# Patient Record
Sex: Female | Born: 1965 | Race: White | Hispanic: No | Marital: Married | Smoking: Never smoker
Health system: Southern US, Community
[De-identification: ages and names within clinical notes are randomized; demographics above are authoritative.]

## PROBLEM LIST (undated history)

## (undated) DIAGNOSIS — F419 Anxiety disorder, unspecified: Secondary | ICD-10-CM

## (undated) DIAGNOSIS — N390 Urinary tract infection, site not specified: Secondary | ICD-10-CM

## (undated) DIAGNOSIS — I10 Essential (primary) hypertension: Secondary | ICD-10-CM

## (undated) DIAGNOSIS — N3010 Interstitial cystitis (chronic) without hematuria: Secondary | ICD-10-CM

## (undated) HISTORY — PX: ABDOMINAL HYSTERECTOMY: SHX81

## (undated) HISTORY — DX: Urinary tract infection, site not specified: N39.0

## (undated) HISTORY — PX: CHOLECYSTECTOMY: SHX55

## (undated) HISTORY — DX: Interstitial cystitis (chronic) without hematuria: N30.10

## (undated) HISTORY — PX: APPENDECTOMY: SHX54

---

## 2008-12-13 ENCOUNTER — Ambulatory Visit: Payer: Self-pay | Admitting: Family Medicine

## 2009-12-19 ENCOUNTER — Ambulatory Visit: Payer: Self-pay | Admitting: Family Medicine

## 2010-03-11 ENCOUNTER — Ambulatory Visit: Payer: Self-pay | Admitting: Obstetrics & Gynecology

## 2010-03-18 ENCOUNTER — Ambulatory Visit: Payer: Self-pay | Admitting: Obstetrics & Gynecology

## 2010-03-19 LAB — PATHOLOGY REPORT

## 2011-10-03 ENCOUNTER — Ambulatory Visit: Payer: Self-pay | Admitting: Family Medicine

## 2013-07-29 ENCOUNTER — Emergency Department: Payer: Self-pay | Admitting: Emergency Medicine

## 2013-07-29 LAB — CBC
HGB: 12 g/dL (ref 12.0–16.0)
MCH: 28.8 pg (ref 26.0–34.0)
MCHC: 32.5 g/dL (ref 32.0–36.0)
Platelet: 289 10*3/uL (ref 150–440)
RBC: 4.17 10*6/uL (ref 3.80–5.20)
RDW: 13.6 % (ref 11.5–14.5)
WBC: 7.4 10*3/uL (ref 3.6–11.0)

## 2013-07-29 LAB — COMPREHENSIVE METABOLIC PANEL
Bilirubin,Total: 0.3 mg/dL (ref 0.2–1.0)
Calcium, Total: 9.1 mg/dL (ref 8.5–10.1)
Chloride: 104 mmol/L (ref 98–107)
Creatinine: 0.65 mg/dL (ref 0.60–1.30)
EGFR (African American): >60
Glucose: 90 mg/dL (ref 65–99)
Osmolality: 273 (ref 275–301)
Potassium: 4.3 mmol/L (ref 3.5–5.1)
SGOT(AST): 24 U/L (ref 15–37)
Sodium: 137 mmol/L (ref 136–145)

## 2013-07-29 LAB — URINALYSIS, COMPLETE
Bilirubin,UR: NEGATIVE
Nitrite: NEGATIVE
RBC,UR: 6 /HPF (ref 0–5)
Specific Gravity: 1.006 (ref 1.003–1.030)
Squamous Epithelial: 8
WBC UR: 16 /HPF (ref 0–5)

## 2013-07-29 LAB — LIPASE, BLOOD: Lipase: 165 U/L (ref 73–393)

## 2013-12-01 ENCOUNTER — Emergency Department: Payer: Self-pay | Admitting: Emergency Medicine

## 2014-06-23 ENCOUNTER — Ambulatory Visit: Payer: Self-pay | Admitting: Physician Assistant

## 2014-06-23 LAB — URINALYSIS, COMPLETE: WBC UR: >30 /HPF (ref 0–5)

## 2014-06-25 LAB — URINE CULTURE

## 2014-07-24 ENCOUNTER — Emergency Department: Payer: Self-pay | Admitting: Emergency Medicine

## 2016-02-21 ENCOUNTER — Ambulatory Visit
Admission: EM | Admit: 2016-02-21 | Discharge: 2016-02-21 | Disposition: A | Discharge status: Home / Self Care | Payer: BLUE CROSS/BLUE SHIELD | Attending: Family Medicine | Admitting: Family Medicine

## 2016-02-21 ENCOUNTER — Encounter: Payer: Self-pay | Admitting: Emergency Medicine

## 2016-02-21 DIAGNOSIS — L299 Pruritus, unspecified: Secondary | ICD-10-CM

## 2016-02-21 DIAGNOSIS — L853 Xerosis cutis: Secondary | ICD-10-CM | POA: Diagnosis not present

## 2016-02-21 DIAGNOSIS — B353 Tinea pedis: Secondary | ICD-10-CM

## 2016-02-21 HISTORY — DX: Anxiety disorder, unspecified: F41.9

## 2016-02-21 HISTORY — DX: Essential (primary) hypertension: I10

## 2016-02-21 MED ORDER — FLUCONAZOLE 150 MG PO TABS: 150.0000 mg | ORAL_TABLET | Freq: Every day | ORAL | Status: DC

## 2016-02-21 NOTE — ED Provider Notes (Signed)
CSN: [id number]     Arrival date & time 02/21/16  1724 History   First MD Initiated Contact with Patient 02/21/16 1758     Chief Complaint  Patient presents with  . Pruritis   (Consider location/radiation/quality/duration/timing/severity/associated sxs/prior Treatment) HPI Comments: 50 yo female with a 3 days h/o severe bilateral feet itching and rash. Denies any fevers, chills, drainage, known contacts with any poison ivy, insect bites.   The history is provided by the patient.    Past Medical History  Diagnosis Date  . Anxiety   . Hypertension    Past Surgical History  Procedure Laterality Date  . Cholecystectomy    . Appendectomy    . Abdominal hysterectomy     History reviewed. No pertinent family history. Social History  Substance Use Topics  . Smoking status: Never Smoker   . Smokeless tobacco: None  . Alcohol Use: No   OB History    No data available     Review of Systems  Allergies  Review of patient's allergies indicates no known allergies.  Home Medications   Prior to Admission medications   Medication Sig Start Date End Date Taking? Authorizing Provider  amitriptyline (ELAVIL) 75 MG tablet Take 75 mg by mouth at bedtime.   Yes Historical Provider, MD  BuPROPion HCl (WELLBUTRIN PO) Take by mouth daily.   Yes Historical Provider, MD  cloNIDine (CATAPRES) 0.1 MG tablet Take 0.1 mg by mouth daily.   Yes Historical Provider, MD  escitalopram (LEXAPRO) 20 MG tablet Take 20 mg by mouth daily.   Yes Historical Provider, MD  metoprolol succinate (TOPROL-XL) 25 MG 24 hr tablet Take 25 mg by mouth daily.   Yes Historical Provider, MD  fluconazole (DIFLUCAN) 150 MG tablet Take 1 tablet (150 mg total) by mouth daily. Repeat in one week 02/21/16   Payton Mccallumrlando Makenah Karas, MD   Meds Ordered and Administered this Visit  Medications - No data to display  BP 151/78 mmHg  Pulse 108  Temp(Src) 98.8 F (37.1 C) (Tympanic)  Resp 16  Ht 5\' 5"  (1.651 m)  Wt 240 lb (108.863 kg)  BMI  39.94 kg/m2  SpO2 97% No data found.   Physical Exam  Skin: Rash noted.  Bilateral feet skin dry skin and interdigital erythema, scaliness  Nursing note and vitals reviewed.   ED Course  Procedures (including critical care time)  Labs Review Labs Reviewed - No data to display  Imaging Review No results found.   Visual Acuity Review  Right Eye Distance:   Left Eye Distance:   Bilateral Distance:    Right Eye Near:   Left Eye Near:    Bilateral Near:         MDM   1. Tinea pedis of both feet   2. Dry skin   3. Pruritus     Discharge Medication List as of 02/21/2016  6:19 PM    START taking these medications   Details  fluconazole (DIFLUCAN) 150 MG tablet Take 1 tablet (150 mg total) by mouth daily. Repeat in one week, Starting 02/21/2016, Until Discontinued, Normal       1.  diagnosis reviewed with patient 2. rx as per orders above; reviewed possible side effects, interactions, risks and benefits  3. Recommend supportive treatment with skin moisturizer cream 4. Follow-up prn if symptoms worsen or don't improve  Payton Mccallumrlando Othello Dickenson, MD 02/21/16 2006

## 2016-02-21 NOTE — ED Notes (Signed)
Patient c/o itching on the bottom and top of her feet that started on Wed.

## 2017-12-02 ENCOUNTER — Other Ambulatory Visit: Payer: Self-pay | Admitting: Family Medicine

## 2017-12-02 DIAGNOSIS — Z1231 Encounter for screening mammogram for malignant neoplasm of breast: Secondary | ICD-10-CM

## 2017-12-08 ENCOUNTER — Ambulatory Visit
Admission: RE | Admit: 2017-12-08 | Discharge: 2017-12-08 | Disposition: A | Discharge status: Home / Self Care | Payer: BLUE CROSS/BLUE SHIELD | Source: Ambulatory Visit | Attending: Family Medicine | Admitting: Family Medicine

## 2017-12-08 ENCOUNTER — Encounter (INDEPENDENT_AMBULATORY_CARE_PROVIDER_SITE_OTHER): Payer: Self-pay

## 2017-12-08 DIAGNOSIS — Z1231 Encounter for screening mammogram for malignant neoplasm of breast: Secondary | ICD-10-CM | POA: Insufficient documentation

## 2017-12-10 ENCOUNTER — Encounter: Payer: Self-pay | Admitting: *Deleted

## 2017-12-10 ENCOUNTER — Ambulatory Visit
Admission: EM | Admit: 2017-12-10 | Discharge: 2017-12-10 | Disposition: A | Discharge status: Home / Self Care | Payer: BLUE CROSS/BLUE SHIELD | Attending: Family Medicine | Admitting: Family Medicine

## 2017-12-10 DIAGNOSIS — M545 Low back pain, unspecified: Secondary | ICD-10-CM

## 2017-12-10 DIAGNOSIS — R1084 Generalized abdominal pain: Secondary | ICD-10-CM | POA: Diagnosis not present

## 2017-12-10 LAB — URINALYSIS, COMPLETE (UACMP) WITH MICROSCOPIC
BACTERIA UA: NONE SEEN
Bilirubin Urine: NEGATIVE
GLUCOSE, UA: NEGATIVE mg/dL
Hgb urine dipstick: NEGATIVE
KETONES UR: NEGATIVE mg/dL
LEUKOCYTES UA: NEGATIVE
Nitrite: NEGATIVE
PROTEIN: NEGATIVE mg/dL
RBC / HPF: NONE SEEN RBC/hpf (ref 0–5)
Specific Gravity, Urine: <1.005 — ABNORMAL LOW (ref 1.005–1.030)
WBC, UA: NONE SEEN WBC/hpf (ref 0–5)
pH: 6 (ref 5.0–8.0)

## 2017-12-10 MED ORDER — CYCLOBENZAPRINE HCL 10 MG PO TABS: 10.0000 mg | ORAL_TABLET | Freq: Three times a day (TID) | ORAL | 0 refills | Status: DC | PRN

## 2017-12-10 MED ORDER — MELOXICAM 15 MG PO TABS: 15.0000 mg | ORAL_TABLET | Freq: Every day | ORAL | 0 refills | Status: DC | PRN

## 2017-12-10 NOTE — Discharge Instructions (Signed)
No evidence of UTI.  Medications as prescribed.  If you worsen, please go to the hospital.  Take care  Dr. Adriana Simasook

## 2017-12-10 NOTE — ED Triage Notes (Signed)
C/o of lower back pain and abd pain. Pt thinks she has kidney infection. Pt has had kidney infection before.

## 2017-12-10 NOTE — ED Provider Notes (Signed)
[facility]  CSN: [id number] Arrival date & time: 12/10/17  1212  History   Chief Complaint Chief Complaint  Patient presents with  . Urinary Tract Infection   HPI  52 year old female presents with low back pain and abdominal pain.  Patient reports recent development of left low back pain and abdominal pain.  She states that her abdomen feels "heavy".  No urinary symptoms.  No nausea vomiting.  No fever or chills.  No constipation or diarrhea.  No flank pain.  No hematuria.  She is status post hysterectomy.  No known exacerbating or relieving factors.  No other complaints at this time.  Past Medical History:  Diagnosis Date  . Anxiety   . Hypertension    Past Surgical History:  Procedure Laterality Date  . ABDOMINAL HYSTERECTOMY    . APPENDECTOMY    . CHOLECYSTECTOMY     OB History   None    Home Medications    Prior to Admission medications   Medication Sig Start Date End Date Taking? Authorizing Provider  amitriptyline (ELAVIL) 75 MG tablet Take 75 mg by mouth at bedtime.   Yes [provider]  BuPROPion HCl (WELLBUTRIN PO) Take by mouth daily.   Yes [provider]  escitalopram (LEXAPRO) 20 MG tablet Take 20 mg by mouth daily.   Yes [provider]  metoprolol succinate (TOPROL-XL) 25 MG 24 hr tablet Take 25 mg by mouth daily.   Yes [provider]  cyclobenzaprine (FLEXERIL) 10 MG tablet Take 1 tablet (10 mg total) by mouth 3 (three) times daily as needed. 12/10/17   Tommie Samsook, Zaakirah Kistner G, DO  meloxicam (MOBIC) 15 MG tablet Take 1 tablet (15 mg total) by mouth daily as needed. 12/10/17   Tommie Samsook, Seanmichael Salmons G, DO   Family History Family History  Problem Relation Age of Onset  . Breast cancer Maternal Aunt        2 mat aunts   Social History Social History   Tobacco Use  . Smoking status: Never Smoker  . Smokeless tobacco: Never Used  Substance Use Topics  . Alcohol use: No  . Drug use: Not on file   Allergies   Patient has  no known allergies.   Review of Systems Review of Systems  Constitutional: Negative for chills and fever.  Gastrointestinal: Positive for abdominal pain. Negative for constipation, diarrhea, nausea and vomiting.  Genitourinary: Negative.    Physical Exam Triage Vital Signs ED Triage Vitals  Enc Vitals Group     BP 12/10/17 1223 114/67     Pulse Rate 12/10/17 1223 79     Resp 12/10/17 1223 16     Temp 12/10/17 1223 98 F (36.7 C)     Temp Source 12/10/17 1223 Oral     SpO2 12/10/17 1223 98 %     Weight 12/10/17 1219 165 lb (74.8 kg)     Height 12/10/17 1219 5\' 4"  (1.626 m)     Head Circumference --      Peak Flow --      Pain Score 12/10/17 1219 8     Pain Loc --      Pain Edu? --      Excl. in GC? --    Updated Vital Signs BP 114/67 (BP Location: Left Arm)   Pulse 79   Temp 98 F (36.7 C) (Oral)   Resp 16   Ht 5\' 4"  (1.626 m)   Wt 165 lb (74.8 kg)   SpO2 98%  BMI 28.32 kg/m   Physical Exam  Constitutional: She is oriented to person, place, and time. She appears well-developed. No distress.  Cardiovascular: Normal rate and regular rhythm.  Pulmonary/Chest: Effort normal and breath sounds normal. She has no wheezes.  Abdominal: Soft.  Tender to palpation diffusely, excluding the right lower quadrant.  No rebound or guarding.  Musculoskeletal:  Left upper lumbar spine with left-sided paraspinal musculature tenderness to palpation.  Neurological: She is alert and oriented to person, place, and time.  Psychiatric: She has a normal mood and affect.  Nursing note and vitals reviewed.  UC Treatments / Results  Labs (all labs ordered are listed, but only abnormal results are displayed) Labs Reviewed  URINALYSIS, COMPLETE (UACMP) WITH MICROSCOPIC - Abnormal; Notable for the following components:      Result Value   Color, Urine STRAW (*)    Specific Gravity, Urine <1.005 (*)    All other components within normal limits    EKG None Radiology No results  found.  Procedures Procedures (including critical care time)  Medications Ordered in UC Medications - No data to display   Initial Impression / Assessment and Plan / UC Course  I have reviewed the triage vital signs and the nursing notes.  Pertinent labs & imaging results that were available during my care of the patient were reviewed by me and considered in my medical decision making (see chart for details).     52 year old female presents with back pain and generalized abdominal pain.  She is well-appearing.  Her exam is nonfocal.  There is no evidence that she has an acute abdomen.  Urinalysis was negative.  She does not have urinary tract infection or pyelonephritis.  I informed her that the etiology of her symptoms is unclear.  Treating with meloxicam and Flexeril as I think this is more muscular in nature.  Final Clinical Impressions(s) / UC Diagnoses   Final diagnoses:  Acute left-sided low back pain without sciatica  Generalized abdominal pain    ED Discharge Orders        Ordered    meloxicam (MOBIC) 15 MG tablet  Daily PRN     12/10/17 1249    cyclobenzaprine (FLEXERIL) 10 MG tablet  3 times daily PRN     12/10/17 1249     Controlled Substance Prescriptions [place] Controlled Substance Registry consulted? Not Applicable   Tommie Sams, DO 12/10/17 1337

## 2017-12-16 ENCOUNTER — Inpatient Hospital Stay
Admission: RE | Admit: 2017-12-16 | Discharge: 2017-12-16 | Disposition: A | Discharge status: Home / Self Care | Payer: Self-pay | Source: Ambulatory Visit | Attending: *Deleted | Admitting: *Deleted

## 2017-12-16 ENCOUNTER — Other Ambulatory Visit: Payer: Self-pay | Admitting: *Deleted

## 2017-12-16 ENCOUNTER — Ambulatory Visit: Payer: Self-pay | Admitting: Obstetrics & Gynecology

## 2017-12-16 DIAGNOSIS — Z9289 Personal history of other medical treatment: Secondary | ICD-10-CM

## 2020-01-05 ENCOUNTER — Other Ambulatory Visit: Payer: Self-pay

## 2020-01-05 ENCOUNTER — Ambulatory Visit
Admission: EM | Admit: 2020-01-05 | Discharge: 2020-01-05 | Disposition: A | Discharge status: Home / Self Care | Payer: BC Managed Care – PPO | Attending: Family Medicine | Admitting: Family Medicine

## 2020-01-05 ENCOUNTER — Encounter: Payer: Self-pay | Admitting: Emergency Medicine

## 2020-01-05 DIAGNOSIS — N390 Urinary tract infection, site not specified: Secondary | ICD-10-CM | POA: Diagnosis present

## 2020-01-05 LAB — URINALYSIS, COMPLETE (UACMP) WITH MICROSCOPIC
Bilirubin Urine: NEGATIVE
Glucose, UA: NEGATIVE mg/dL
Ketones, ur: NEGATIVE mg/dL
Nitrite: NEGATIVE
Protein, ur: NEGATIVE mg/dL
Specific Gravity, Urine: 1.02 (ref 1.005–1.030)
WBC, UA: >50 WBC/hpf (ref 0–5)
pH: 7 (ref 5.0–8.0)

## 2020-01-05 MED ORDER — NITROFURANTOIN MONOHYD MACRO 100 MG PO CAPS: 100.0000 mg | ORAL_CAPSULE | Freq: Two times a day (BID) | ORAL | 0 refills | Status: DC

## 2020-01-05 MED ORDER — PHENAZOPYRIDINE HCL 200 MG PO TABS: 200.0000 mg | ORAL_TABLET | Freq: Three times a day (TID) | ORAL | 0 refills | Status: DC | PRN

## 2020-01-05 NOTE — Discharge Instructions (Addendum)
It was very nice seeing you today in clinic. Thank you for entrusting me with your care.  ° °As discussed, your urine is POSITIVE for infection. Will approach treatment as follows: ° °Prescription has been sent to your pharmacy for antibiotics.  °Please pick up and take as directed. FINISH the entire course of medication even if you are feeling better.  °A culture will be sent on your provided sample. If it comes back resistant to what I have prescribed you, someone will call you and let you know that we will need to change antibiotics. °Increase fluid intake as much as possible to flush your urinary tract.  °Water is always the best.  °Avoid caffeine until your infection clears up, as it can contribute to painful bladder spasms.  °May use Tylenol and/or Ibuprofen as needed for pain/fever. ° °Make arrangements to follow up with your regular doctor in 1 week for re-evaluation. If your symptoms/condition worsens, please seek follow up care either here or in the ER. Please remember, our [facility] providers are "right here with you" when you need us.  ° °Again, it was my pleasure to take care of you today. Thank you for choosing our clinic. I hope that you start to feel better quickly.  ° °Danijah Noh, MSN, APRN, FNP-C, CEN °Advanced Practice Provider °[facility] [place] [facility] ° °

## 2020-01-05 NOTE — ED Triage Notes (Signed)
Patient c/o burning when urinating and urinary frequency and urgency that started yesterday.

## 2020-01-06 NOTE — ED Provider Notes (Signed)
[place] [place]   Name: Vanessa Huffman DOB: 22-Nov-1965 MRN: [id number] CSN: [id number] PCP: [facility]  Arrival date and time:  01/05/20 0946  Chief Complaint:  Dysuria  NOTE: Prior to seeing the patient today, I have reviewed the triage nursing documentation and vital signs. Clinical staff has updated patient's PMH/PSHx, current medication list, and drug allergies/intolerances to ensure comprehensive history available to assist in medical decision making.   History:   HPI: Vanessa Huffman is a 54 y.o. female who presents today with complaints of urinary symptoms that began with acute onset last night. She complains of dysuria, frequency, and urgency. She has not appreciated any gross hematuria, nor has she noticed her urine being malodorous. Patient denies any associated nausea, vomiting, fever, and chills. She has experienced pain in her lower back, however not in her flank area or abdomen. She experienced new onset urinary incontinence last night. Patient advises that she does not have a past medical history that is significant for recurrent urinary tract infections. She denies any vaginal pain, bleeding, or discharge.  Past Medical History:  Diagnosis Date  . Anxiety   . Hypertension     Past Surgical History:  Procedure Laterality Date  . ABDOMINAL HYSTERECTOMY    . APPENDECTOMY    . CHOLECYSTECTOMY      Family History  Problem Relation Age of Onset  . Breast cancer Maternal Aunt        2 mat aunts    Social History   Tobacco Use  . Smoking status: Never Smoker  . Smokeless tobacco: Never Used  Substance Use Topics  . Alcohol use: No  . Drug use: Not on file    There are no problems to display for this patient.   Home Medications:    Current Meds  Medication Sig  . amitriptyline (ELAVIL) 75 MG tablet Take 75 mg by mouth at bedtime.  . BuPROPion HCl (WELLBUTRIN PO) Take by mouth daily.  Marland Kitchen escitalopram (LEXAPRO) 20 MG tablet Take 20  mg by mouth daily.  . [DISCONTINUED] metoprolol succinate (TOPROL-XL) 25 MG 24 hr tablet Take 25 mg by mouth daily.    Allergies:   Patient has no known allergies.  Review of Systems (ROS): Review of Systems  Constitutional: Negative for chills and fever.  Respiratory: Negative for cough and shortness of breath.   Cardiovascular: Negative for chest pain and palpitations.  Gastrointestinal: Negative for abdominal pain, nausea and vomiting.  Genitourinary: Positive for dysuria, frequency and urgency. Negative for decreased urine volume, hematuria, pelvic pain, vaginal bleeding, vaginal discharge and vaginal pain.  Musculoskeletal: Positive for back pain.  Skin: Negative for color change, pallor and rash.  Neurological: Negative for dizziness, syncope, weakness and headaches.  All other systems reviewed and are negative.    Vital Signs: Today's Vitals   01/05/20 0959 01/05/20 1002 01/05/20 1033  BP:  134/87   Pulse:  86   Resp:  14   Temp:  98 F (36.7 C)   TempSrc:  Oral   SpO2:  98%   Weight: 210 lb (95.3 kg)    Height: 5\' 5"  (1.651 m)    PainSc: 3   3     Physical Exam: Physical Exam  Constitutional: She is oriented to person, place, and time and well-developed, well-nourished, and in no distress.  HENT:  Head: Normocephalic and atraumatic.  Eyes: Pupils are equal, round, and reactive to light.  Cardiovascular: Normal rate, regular rhythm, normal heart sounds and intact  distal pulses.  Pulmonary/Chest: Effort normal and breath sounds normal.  Abdominal: Soft. Normal appearance. She exhibits no distension. There is abdominal tenderness in the suprapubic area. There is no CVA tenderness.  Neurological: She is alert and oriented to person, place, and time. Gait normal.  Skin: Skin is warm and dry. No rash noted. She is not diaphoretic.  Psychiatric: Memory, affect and judgment normal. Her mood appears anxious.  Nursing note and vitals reviewed.   Urgent Care Treatments  / Results:   LABS: PLEASE NOTE: all labs that were ordered this encounter are listed, however only abnormal results are displayed. Labs Reviewed  URINALYSIS, COMPLETE (UACMP) WITH MICROSCOPIC - Abnormal; Notable for the following components:      Result Value   Color, Urine STRAW (*)    APPearance HAZY (*)    Hgb urine dipstick MODERATE (*)    Leukocytes,Ua LARGE (*)    Bacteria, UA FEW (*)    All other components within normal limits  URINE CULTURE    EKG: -None  RADIOLOGY: No results found.  PROCEDURES: Procedures  MEDICATIONS RECEIVED THIS VISIT: Medications - No data to display  PERTINENT CLINICAL COURSE NOTES/UPDATES:    Initial Impression / Assessment and Plan / Urgent Care Course:  Pertinent labs & imaging results that were available during my care of the patient were personally reviewed by me and considered in my medical decision making (see lab/imaging section of note for values and interpretations).  Vanessa Huffman is a 54 y.o. female who presents to [facility] today with complaints of Dysuria   Patient is well appearing overall in clinic today. She does not appear to be in any acute distress. Presenting symptoms (see HPI) and exam as documented above. Work up in clinic and subsequent plans for treatment as follows:  Marland Kitchen UA was (+) for infection; reflex culture sent.  o Will treat with a 5 day course of nitrofurantoin. Patient encouraged to complete the entire course of antibiotics even if she begins to feel better. She was advised that if culture demonstrates resistance to the prescribed antibiotic, she will be contacted and advised of the need to change the antibiotic being used to treat her infection.   o Patient encouraged to increase her fluid intake as much as possible. Discussed that water is always best to flush the urinary tract.   o She was advised to avoid caffeine containing fluids until her infections clears, as caffeine can cause her to  experience painful bladder spasms.   o May use Tylenol and/or Ibuprofen as needed for pain/fever. Will also send in Rx for phenazopyridine to help relieve her current urinary pain.   Discussed follow up with primary care physician in 1 week for re-evaluation. I have reviewed the follow up and strict return precautions for any new or worsening symptoms. Patient is aware of symptoms that would be deemed urgent/emergent, and would thus require further evaluation either here or in the emergency department. At the time of discharge, she verbalized understanding and consent with the discharge plan as it was reviewed with her. All questions were fielded by provider and/or clinic staff prior to patient discharge.    Final Clinical Impressions / Urgent Care Diagnoses:   Final diagnoses:  Urinary tract infection without hematuria, site unspecified    New Prescriptions:  [place] Controlled Substance Registry consulted? Not Applicable  Meds ordered this encounter  Medications  . nitrofurantoin, macrocrystal-monohydrate, (MACROBID) 100 MG capsule    Sig: Take 1 capsule (100 mg  total) by mouth 2 (two) times daily.    Dispense:  10 capsule    Refill:  0  . phenazopyridine (PYRIDIUM) 200 MG tablet    Sig: Take 1 tablet (200 mg total) by mouth 3 (three) times daily as needed for pain.    Dispense:  9 tablet    Refill:  0    Recommended Follow up Care:  Patient encouraged to follow up with the following provider within the specified time frame, or sooner as dictated by the severity of her symptoms. As always, she was instructed that for any urgent/emergent care needs, she should seek care either here or in the emergency department for more immediate evaluation.  Follow-up Information    [place] In 1 week.   Why: General reassessment of symptoms if not improving Contact information: [address] [place] [address] [phone]         NOTE: This note was prepared using Dragon  dictation software along with smaller phrase technology. Despite my best ability to proofread, there is the potential that transcriptional errors may still occur from this process, and are completely unintentional.    Verlee Monte, NP 01/06/20 1729

## 2020-01-08 LAB — URINE CULTURE: Culture: 60000 — AB

## 2020-02-23 ENCOUNTER — Ambulatory Visit
Admission: EM | Admit: 2020-02-23 | Discharge: 2020-02-23 | Disposition: A | Discharge status: Home / Self Care | Payer: BC Managed Care – PPO | Attending: Family Medicine | Admitting: Family Medicine

## 2020-02-23 ENCOUNTER — Other Ambulatory Visit: Payer: Self-pay

## 2020-02-23 ENCOUNTER — Encounter: Payer: Self-pay | Admitting: Emergency Medicine

## 2020-02-23 DIAGNOSIS — R3915 Urgency of urination: Secondary | ICD-10-CM | POA: Insufficient documentation

## 2020-02-23 LAB — URINALYSIS, COMPLETE (UACMP) WITH MICROSCOPIC: WBC, UA: >50 WBC/hpf (ref 0–5)

## 2020-02-23 MED ORDER — CEPHALEXIN 500 MG PO CAPS: 500.0000 mg | ORAL_CAPSULE | Freq: Two times a day (BID) | ORAL | 0 refills | Status: DC

## 2020-02-23 NOTE — Discharge Instructions (Signed)
Increase water intake

## 2020-02-23 NOTE — ED Triage Notes (Signed)
Patient c/o urinary urgency and frequency and bladder spasms that started last night.

## 2020-02-23 NOTE — ED Provider Notes (Signed)
[facility]    CSN: [id number] Arrival date & time: 02/23/20  1332      History   Chief Complaint Chief Complaint  Patient presents with  . Urinary Urgency    HPI Vanessa Huffman is a 54 y.o. female.   54 yo female with a c/o urinary frequency and urgency since yesterday and associated with nausea. Denies any vomiting, fevers, chills, hematuria, abdominal or back pain.  States symptoms are similar to prior UTIs she's had. Started taking AZO today.      Past Medical History:  Diagnosis Date  . Anxiety   . Hypertension     There are no problems to display for this patient.   Past Surgical History:  Procedure Laterality Date  . ABDOMINAL HYSTERECTOMY    . APPENDECTOMY    . CHOLECYSTECTOMY      OB History   No obstetric history on file.      Home Medications    Prior to Admission medications   Medication Sig Start Date End Date Taking? Authorizing Provider  amitriptyline (ELAVIL) 75 MG tablet Take 75 mg by mouth at bedtime.   Yes [provider]  BuPROPion HCl (WELLBUTRIN PO) Take by mouth daily.   Yes [provider]  escitalopram (LEXAPRO) 20 MG tablet Take 20 mg by mouth daily.   Yes [provider]  cephALEXin (KEFLEX) 500 MG capsule Take 1 capsule (500 mg total) by mouth 2 (two) times daily. 02/23/20   Payton Mccallum, MD  nitrofurantoin, macrocrystal-monohydrate, (MACROBID) 100 MG capsule Take 1 capsule (100 mg total) by mouth 2 (two) times daily. 01/05/20   Verlee Monte, NP  phenazopyridine (PYRIDIUM) 200 MG tablet Take 1 tablet (200 mg total) by mouth 3 (three) times daily as needed for pain. 01/05/20   Verlee Monte, NP  metoprolol succinate (TOPROL-XL) 25 MG 24 hr tablet Take 25 mg by mouth daily.  01/05/20  [provider]    Family History Family History  Problem Relation Age of Onset  . Breast cancer Maternal Aunt        2 mat aunts    Social History Social History   Tobacco Use  . Smoking  status: Never Smoker  . Smokeless tobacco: Never Used  Vaping Use  . Vaping Use: Never used  Substance Use Topics  . Alcohol use: No  . Drug use: Not on file     Allergies   Acetaminophen-codeine and Codeine   Review of Systems Review of Systems   Physical Exam Triage Vital Signs ED Triage Vitals  Enc Vitals Group     BP 02/23/20 1404 (!) 117/58     Pulse Rate 02/23/20 1404 (!) 103     Resp 02/23/20 1404 14     Temp 02/23/20 1404 98.6 F (37 C)     Temp Source 02/23/20 1404 Oral     SpO2 02/23/20 1404 96 %     Weight 02/23/20 1401 215 lb (97.5 kg)     Height 02/23/20 1401 5\' 5"  (1.651 m)     Head Circumference --      Peak Flow --      Pain Score 02/23/20 1401 8     Pain Loc --      Pain Edu? --      Excl. in GC? --    No data found.  Updated Vital Signs BP (!) 117/58 (BP Location: Left Arm)   Pulse (!) 103   Temp 98.6 F (37 C) (  Oral)   Resp 14   Ht 5\' 5"  (1.651 m)   Wt 97.5 kg   SpO2 96%   BMI 35.78 kg/m   Visual Acuity Right Eye Distance:   Left Eye Distance:   Bilateral Distance:    Right Eye Near:   Left Eye Near:    Bilateral Near:     Physical Exam Vitals and nursing note reviewed.  Constitutional:      General: She is not in acute distress.    Appearance: Normal appearance. She is not ill-appearing, toxic-appearing or diaphoretic.  Abdominal:     General: There is no distension.     Palpations: Abdomen is soft.  Neurological:     Mental Status: She is alert.      UC Treatments / Results  Labs (all labs ordered are listed, but only abnormal results are displayed) Labs Reviewed  URINALYSIS, COMPLETE (UACMP) WITH MICROSCOPIC - Abnormal; Notable for the following components:      Result Value   Color, Urine ORANGE (*)    APPearance CLOUDY (*)    Glucose, UA   (*)    Value: TEST NOT REPORTED DUE TO COLOR INTERFERENCE OF URINE PIGMENT   Hgb urine dipstick   (*)    Value: TEST NOT REPORTED DUE TO COLOR INTERFERENCE OF URINE  PIGMENT   Bilirubin Urine   (*)    Value: TEST NOT REPORTED DUE TO COLOR INTERFERENCE OF URINE PIGMENT   Ketones, ur   (*)    Value: TEST NOT REPORTED DUE TO COLOR INTERFERENCE OF URINE PIGMENT   Protein, ur   (*)    Value: TEST NOT REPORTED DUE TO COLOR INTERFERENCE OF URINE PIGMENT   Nitrite   (*)    Value: TEST NOT REPORTED DUE TO COLOR INTERFERENCE OF URINE PIGMENT   Leukocytes,Ua   (*)    Value: TEST NOT REPORTED DUE TO COLOR INTERFERENCE OF URINE PIGMENT   Bacteria, UA FEW (*)    All other components within normal limits  URINE CULTURE    EKG   Radiology No results found.  Procedures Procedures (including critical care time)  Medications Ordered in UC Medications - No data to display  Initial Impression / Assessment and Plan / UC Course  I have reviewed the triage vital signs and the nursing notes.  Pertinent labs & imaging results that were available during my care of the patient were reviewed by me and considered in my medical decision making (see chart for details).      Final Clinical Impressions(s) / UC Diagnoses   Final diagnoses:  Urinary urgency     Discharge Instructions     Increase water intake    ED Prescriptions    Medication Sig Dispense Auth. Provider   cephALEXin (KEFLEX) 500 MG capsule Take 1 capsule (500 mg total) by mouth 2 (two) times daily. 14 capsule , MD      1. Lab results and diagnosis reviewed with patient 2. rx as per orders above; reviewed possible side effects, interactions, risks and benefits  3. Recommend supportive treatment as above 4. Follow-up prn if symptoms worsen or don't improve   PDMP not reviewed this encounter.   Payton Mccallum, MD 02/23/20 1447

## 2020-02-26 LAB — URINE CULTURE: Special Requests: NORMAL

## 2020-03-05 ENCOUNTER — Other Ambulatory Visit: Payer: Self-pay | Admitting: Physician Assistant

## 2020-03-05 DIAGNOSIS — Z1231 Encounter for screening mammogram for malignant neoplasm of breast: Secondary | ICD-10-CM

## 2020-03-12 NOTE — Progress Notes (Signed)
03/13/2020 4:37 PM   Renee Rival Litke [birth year] [id number]  Referring provider: Leim Fabry, MD [address] [place],  [place] [address] Chief Complaint  Patient presents with   Hematuria    HPI: Anastyn Ayars is a 54 y.o. female who presents today for further evaluation and management for UTIs and IC.    She was a previous patient of Dr. Lonna Cobb for microscopic hematuria and pelvic pain last seen in 2015.    CT hematuria on 09/05/2013 at [facility] showed small left parapelvic cyst, otherwise unremarkable.   She was presented to Urgent Care on 01/05/2020. She had some dysuria, frequency and urgency. UA showed moderate blood, large leukocytes and few bacteria. Urin culture indicative for E. Coli. Patient was treated with Macrobid 100 mg BID.   She was seen in the [facility] on 02/23/2020 for urinary urgency. She had associated nausea. Stated symptoms were similar to prior UTIs she's had. Started taking AZO today. UA showed WBC >50, RBC 21-50 and few bacteria. Urine culture showed multiple species present (suggest recollection). She was treated with Keflex 500 mg BID.   She had a follow up with her PCP on 02/29/2020. She had some reoccurring UTI symptoms but her urine culture did not show >100,000. She recently had completed antibiotics for a mild UTI infection and noted some vaginal itching. She reported having a hematuria work up 5 year ago.   Wet prep was negative for clue and yeast. UA showed 1+ blood and 2+ leukocytes. Urine culture showed mixed flora, probable contamination.   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    No personal history of cancer.   She has been taking tylenol and elavil. Urinary symptoms started in 12/2019. She notes a "funny feeling during" urination and at times she stops urinating. She is currently not on AZO.   Denies sexual activity.   She is on an exclusion diet for IC  symptoms. She is interested in starting a new diet to lose weight. Reports eating yogurt and staying away from spicy foods and tomatoes.   Patient reports that she is postmenopausal.    PMH: Past Medical History:  Diagnosis Date   Anxiety    Hypertension     Surgical History: Past Surgical History:  Procedure Laterality Date   ABDOMINAL HYSTERECTOMY     APPENDECTOMY     CHOLECYSTECTOMY      Home Medications:  Allergies as of 03/13/2020      Reactions   Acetaminophen-codeine Other (See Comments)   Lethargy   Codeine Other (See Comments)      Medication List       Accurate as of March 13, 2020  4:37 PM. If you have any questions, ask your nurse or doctor.        STOP taking these medications   cephALEXin 500 MG capsule Commonly known as: KEFLEX Stopped by: Vanna Scotland, MD   nitrofurantoin (macrocrystal-monohydrate) 100 MG capsule Commonly known as: MACROBID Stopped by: Vanna Scotland, MD   phenazopyridine 200 MG tablet Commonly known as: PYRIDIUM Stopped by: Vanna Scotland, MD     TAKE these medications   amitriptyline 75 MG tablet Commonly known as: ELAVIL Take 75 mg by mouth at bedtime.   buPROPion 150 MG 24 hr tablet Commonly known as: WELLBUTRIN XL Take 150 mg by mouth every morning. What changed: Another medication with the same name was removed. Continue taking this medication,  and follow the directions you see here. Changed by: Vanna Scotland, MD   escitalopram 20 MG tablet Commonly known as: LEXAPRO Take 20 mg by mouth daily.       Allergies:  Allergies  Allergen Reactions   Acetaminophen-Codeine Other (See Comments)    Lethargy   Codeine Other (See Comments)    Family History: Family History  Problem Relation Age of Onset   Breast cancer Maternal Aunt        2 mat aunts    Social History:  reports that she has never smoked. She has never used smokeless tobacco. She reports that she does not drink alcohol. No history on  file for drug use.   Physical Exam: BP (!) 148/78    Pulse (!) 106    Ht 5\' 6"  (1.676 m)    Wt (!) 215 lb (97.5 kg)    BMI 34.70 kg/m   Constitutional:  Alert and oriented, No acute distress. HEENT: [place] AT, moist mucus membranes.  Trachea midline, no masses. Cardiovascular: No clubbing, cyanosis, or edema. Respiratory: Normal respiratory effort, no increased work of breathing. Skin: No rashes, bruises or suspicious lesions. Neurologic: Grossly intact, no focal deficits, moving all 4 extremities. Psychiatric: Normal mood and affect.  Urinalysis Cloudy in appearance, nitrite positive, WBC >30, RBC >30, many bacteria.   Assessment & Plan:    1. Acute cystitis  Patient feels as though she has had a couple of infections these past few months. Patient is postmenopausal.  Urine is consistent with infection as seen below. UA shows cloudy appearance, nitrite positive, WBC >30, RBC >30, many bacteria. -Urine culture sent. Advised to use topical estrogen cream (pea size amount) 3 times/week and adding probiotics to her diet. Avoid cranberry tablets secondary to IC.  2. IC Difficult to differentiate at this time due to current symptoms, will reassess once infection clears. Continue exclusion diet. Continue on elavil.  3. Pelvic floor dysfunction Patient will benefit from PT referral but kindly declined at this time due to family obligations.    [facility] [address], [address] [place], [place] [phone]  I, [phone], am acting as a scribe for Dr. Theador Hawthorne.  I have reviewed the above documentation for accuracy and completeness, and I agree with the above.   Vanna Scotland, MD  I spent 45 total minutes on the day of the encounter including pre-visit review of the medical record, face-to-face time with the patient, and post visit ordering of labs/imaging/tests.

## 2020-03-13 ENCOUNTER — Other Ambulatory Visit: Payer: Self-pay

## 2020-03-13 ENCOUNTER — Ambulatory Visit (INDEPENDENT_AMBULATORY_CARE_PROVIDER_SITE_OTHER): Payer: BC Managed Care – PPO | Admitting: Urology

## 2020-03-13 VITALS — BP 148/78 | HR 106 | Ht 66.0 in | Wt 215.0 lb

## 2020-03-13 DIAGNOSIS — R319 Hematuria, unspecified: Secondary | ICD-10-CM

## 2020-03-13 DIAGNOSIS — M6289 Other specified disorders of muscle: Secondary | ICD-10-CM

## 2020-03-13 DIAGNOSIS — N3010 Interstitial cystitis (chronic) without hematuria: Secondary | ICD-10-CM

## 2020-03-13 DIAGNOSIS — N3000 Acute cystitis without hematuria: Secondary | ICD-10-CM

## 2020-03-13 MED ORDER — ESTRADIOL 0.1 MG/GM VA CREA: TOPICAL_CREAM | VAGINAL | 12 refills | Status: DC

## 2020-03-13 MED ORDER — SULFAMETHOXAZOLE-TRIMETHOPRIM 800-160 MG PO TABS: 1.0000 | ORAL_TABLET | Freq: Two times a day (BID) | ORAL | 0 refills | Status: DC

## 2020-03-14 LAB — URINALYSIS, COMPLETE
Bilirubin, UA: NEGATIVE
Glucose, UA: NEGATIVE
Nitrite, UA: POSITIVE — AB
Specific Gravity, UA: >1.03 — ABNORMAL HIGH (ref 1.005–1.030)
Urobilinogen, Ur: 0.2 mg/dL (ref 0.2–1.0)
pH, UA: 5 (ref 5.0–7.5)

## 2020-03-14 LAB — MICROSCOPIC EXAMINATION: WBC, UA: >30 /hpf — AB (ref 0–5)

## 2020-03-16 LAB — CULTURE, URINE COMPREHENSIVE

## 2020-03-18 ENCOUNTER — Telehealth: Payer: Self-pay | Admitting: *Deleted

## 2020-03-18 ENCOUNTER — Encounter: Payer: Self-pay | Admitting: Urology

## 2020-03-18 NOTE — Telephone Encounter (Addendum)
Patient informed, answered all questions regarding negative results. Informed if symptoms persists to contact our office. States her symptoms have improved since taking Bactrim.   ----- Message from Vanna Scotland, MD sent at 03/18/2020  8:35 AM EDT ----- UCx was negative, grew mixed flora.  Vanna Scotland, MD

## 2020-03-22 ENCOUNTER — Other Ambulatory Visit: Payer: Self-pay | Admitting: Family Medicine

## 2020-03-22 DIAGNOSIS — R3129 Other microscopic hematuria: Secondary | ICD-10-CM

## 2020-03-29 ENCOUNTER — Ambulatory Visit
Admission: RE | Admit: 2020-03-29 | Discharge: 2020-03-29 | Disposition: A | Discharge status: Home / Self Care | Payer: BC Managed Care – PPO | Source: Ambulatory Visit | Attending: Family Medicine | Admitting: Family Medicine

## 2020-03-29 ENCOUNTER — Other Ambulatory Visit: Payer: Self-pay

## 2020-03-29 DIAGNOSIS — R3129 Other microscopic hematuria: Secondary | ICD-10-CM | POA: Diagnosis present

## 2020-03-29 MED ORDER — IOHEXOL 300 MG/ML  SOLN
125.0000 mL | Freq: Once | INTRAMUSCULAR | Status: AC | PRN
  Administered 2020-03-29: 125 mL via INTRAVENOUS

## 2020-04-09 ENCOUNTER — Encounter: Payer: Self-pay | Admitting: Urology

## 2020-04-09 NOTE — Telephone Encounter (Signed)
I am not sure you mentioned this to me before.  Is this a new problem?  Let us move your appointment up or have you come in earlier to see a PA in our office to make sure everything is okay, there is no infection, and to make sure you are emptying your bladder.  Vanna Scotland, MD

## 2020-04-10 ENCOUNTER — Other Ambulatory Visit: Payer: Self-pay

## 2020-04-10 ENCOUNTER — Ambulatory Visit: Payer: BC Managed Care – PPO | Admitting: Physician Assistant

## 2020-04-10 VITALS — BP 123/83 | HR 53

## 2020-04-10 DIAGNOSIS — N281 Cyst of kidney, acquired: Secondary | ICD-10-CM

## 2020-04-10 DIAGNOSIS — N2889 Other specified disorders of kidney and ureter: Secondary | ICD-10-CM

## 2020-04-10 DIAGNOSIS — R32 Unspecified urinary incontinence: Secondary | ICD-10-CM

## 2020-04-10 DIAGNOSIS — N3944 Nocturnal enuresis: Secondary | ICD-10-CM

## 2020-04-10 LAB — BLADDER SCAN AMB NON-IMAGING: Scan Result: 31

## 2020-04-10 MED ORDER — MIRABEGRON ER 25 MG PO TB24: 25.0000 mg | ORAL_TABLET | Freq: Every day | ORAL | 0 refills | Status: DC

## 2020-04-10 NOTE — Progress Notes (Signed)
04/10/2020 4:42 PM   Vanessa Huffman December 10, 1965 [id number]  CC: Chief Complaint  Patient presents with  . Urinary Incontinence    HPI: Vanessa Huffman is a 54 y.o. female with PMH occasional versus recurrent UTI, IC on Elavil, and pelvic floor dysfunction who presents today for evaluation of nighttime urinary incontinence.  She was seen most recently by Dr. Apolinar Junes on 03/13/2020 for evaluation of possible recurrent UTI and IC.  She was started on topical vaginal estrogen cream and daily probiotics at that time with plans for symptom recheck in 1 month.  Today she reports an additional symptom of occasional nocturnal enuresis.  She typically experiences nocturia x1, however approximately every 6 weeks, she has an episode of urge incontinence or insensate leaking overnight.  This is sometimes associated with UTI symptoms.  She also reports a "years" long history of daytime urgency and urge incontinence, which began after menopause.  She reports leaking daily and denies stress incontinence.  Overall, she is more bothered by her nighttime symptoms than her daytime symptoms.  She does snore, however she has never completed a sleep study.  She denies lower extremity edema. She drinks 2 cups of coffee each morning followed by water throughout the day, usually stopping at dinnertime around 6 PM.  She does have sips with bedtime meds as well.  She also reports a history of chronic constipation.  Additionally, patient has questions about findings from her recent CT urogram.  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  In-office UA today positive for trace-intact blood and trace leukocyte esterase; urine microscopy with >10 epithelial cells. PVR 48mL.  PMH: Past Medical History:  Diagnosis Date  . Anxiety   . Hypertension     Surgical History: Past Surgical History:  Procedure Laterality Date  . ABDOMINAL HYSTERECTOMY     . APPENDECTOMY    . CHOLECYSTECTOMY      Home Medications:  Allergies as of 04/10/2020      Reactions   Acetaminophen-codeine Other (See Comments)   Lethargy   Codeine Other (See Comments)      Medication List       Accurate as of April 10, 2020 11:59 PM. If you have any questions, ask your nurse or doctor.        amitriptyline 75 MG tablet Commonly known as: ELAVIL Take 75 mg by mouth at bedtime.   amitriptyline 100 MG tablet Commonly known as: ELAVIL Take 100 mg by mouth at bedtime.   atorvastatin 40 MG tablet Commonly known as: LIPITOR Take 40 mg by mouth at bedtime.   buPROPion 150 MG 24 hr tablet Commonly known as: WELLBUTRIN XL Take 150 mg by mouth every morning.   escitalopram 20 MG tablet Commonly known as: LEXAPRO Take 20 mg by mouth daily.   estradiol 0.1 MG/GM vaginal cream Commonly known as: ESTRACE Dispose of applicator. Apply pea size amount to area Monday, Wednesday, Friday evening.   mirabegron ER 25 MG Tb24 tablet Commonly known as: MYRBETRIQ Take 1 tablet (25 mg total) by mouth daily. Started by: Carman Ching, PA-C   sulfamethoxazole-trimethoprim 800-160 MG tablet Commonly known as: BACTRIM DS Take 1 tablet by mouth every 12 (twelve) hours.       Allergies:  Allergies  Allergen Reactions  . Acetaminophen-Codeine Other (See Comments)    Lethargy  . Codeine Other (See Comments)    Family History: Family History  Problem Relation Age of Onset  .  Breast cancer Maternal Aunt        2 mat aunts    Social History:   reports that she has never smoked. She has never used smokeless tobacco. She reports that she does not drink alcohol. No history on file for drug use.  Physical Exam: BP 123/83   Pulse (!) 53   Constitutional:  Alert and oriented, no acute distress, nontoxic appearing HEENT: [place], AT Cardiovascular: No clubbing, cyanosis, or edema Respiratory: Normal respiratory effort, no increased work of breathing Skin:  No rashes, bruises or suspicious lesions Neurologic: Grossly intact, no focal deficits, moving all 4 extremities Psychiatric: Normal mood and affect  Laboratory Data: Results for orders placed or performed in visit on 04/10/20  Microscopic Examination   Urine  Result Value Ref Range   WBC, UA 0-5 0 - 5 /hpf   RBC 0-2 0 - 2 /hpf   Epithelial Cells (non renal) >10 (A) 0 - 10 /hpf   Renal Epithel, UA 0-10 (A) None seen /hpf   Bacteria, UA None seen None seen/Few  Urinalysis, Complete  Result Value Ref Range   Specific Gravity, UA 1.015 1.005 - 1.030   pH, UA 6.5 5.0 - 7.5   Color, UA Yellow Yellow   Appearance Ur Hazy (A) Clear   Leukocytes,UA Trace (A) Negative   Protein,UA Negative Negative/Trace   Glucose, UA Negative Negative   Ketones, UA Negative Negative   RBC, UA Trace (A) Negative   Bilirubin, UA Negative Negative   Urobilinogen, Ur 0.2 0.2 - 1.0 mg/dL   Nitrite, UA Negative Negative   Microscopic Examination See below:   Creatinine, serum  Result Value Ref Range   Creatinine, Ser 0.72 0.57 - 1.00 mg/dL   GFR calc non Af Amer 94 >59 mL/min/1.73   GFR calc Af Amer 108 >59 mL/min/1.73  BLADDER SCAN AMB NON-IMAGING  Result Value Ref Range   Scan Result 31 ML    Pertinent Imaging: Results for orders placed during the hospital encounter of 03/29/20  CT HEMATURIA WORKUP  Narrative CLINICAL DATA:  Microscopic hematuria.  EXAM: CT ABDOMEN AND PELVIS WITHOUT AND WITH CONTRAST  TECHNIQUE: Multidetector CT imaging of the abdomen and pelvis was performed following the standard protocol before and following the bolus administration of intravenous contrast.  CONTRAST:  OMNIPAQUE IOHEXOL 300 MG/ML  SOLN  COMPARISON:  07/29/13  FINDINGS: Lower chest: 3 mm nodule is identified in the right middle lobe. Not imaged on previous exam.  Hepatobiliary: No suspicious liver abnormality. Previous cholecystectomy. No biliary ductal dilatation.  Pancreas:  Unremarkable. No pancreatic ductal dilatation or surrounding inflammatory changes.  Spleen: Normal in size without focal abnormality.  Adrenals/Urinary Tract: Normal adrenal glands. 8 mm hypodense lesion in the posterior cortex of the upper pole of left kidney is unchanged. Although too small to reliably characterize stability favors a benign abnormality, likely cysts. Multiple left-sided parapelvic cysts. No kidney stones identified bilaterally unchanged right sided pelvocaliectasis. On the delayed images there is symmetric opacification of the collecting systems. Normal caliber bilateral ureters. No suspicious filling defects within the ureters bilaterally. Urinary bladder appears normal.  Stomach/Bowel: The stomach is normal in appearance. No bowel wall thickening, inflammation, or distension. Postoperative changes from previous appendectomy. No bowel wall thickening, inflammation or distension.  Vascular/Lymphatic: Aortic atherosclerosis. No abdominopelvic adenopathy.  Reproductive: Status post hysterectomy. No adnexal masses.  Other: No free fluid or fluid collections.  Musculoskeletal: Lumbar degenerative disc disease. No acute or suspicious osseous findings.  IMPRESSION: 1. No acute  findings within the abdomen or pelvis. No urinary tract calculi or focal lesion identified to explain patient's hematuria. 2. Unchanged right sided pelvocaliectasis with normal caliber ureter. 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 3. 3 mm nodule is identified within the right lung. Not imaged on previous exam. No follow-up needed if patient is low-risk. Non-contrast chest CT can be considered in 12 months if patient is high-risk. This recommendation follows the consensus statement: Guidelines for Management of Incidental Pulmonary Nodules Detected on CT Images: From the Fleischner Society 2017; Radiology 2017; 284:228-243. 4. Aortic atherosclerosis.  Aortic Atherosclerosis  (ICD10-I70.0).   Electronically Signed By: Signa Kell M.D. On: 03/29/2020 13:27  No results found for this or any previous visit.  I personally reviewed the images referenced above and note right pelvocaliectasis.  Assessment & Plan:   1. Nocturnal enuresis UA benign, PVR WNL.  I am not concerned for urinary infection or incomplete bladder emptying as contributory.  Based on her history, I suspect this problem is multifactorial with contributors including OAB, IC, and possible sleep apnea, however in the absence of nocturia sleep apnea is less likely.  I recommended therapy for OAB at this time to see if this improves her symptoms.  She is in agreement with this plan.  Myrbetriq 25 mg samples provided today, will plan for symptom recheck with Dr. Apolinar Junes in 4 weeks. - Urinalysis, Complete - BLADDER SCAN AMB NON-IMAGING - mirabegron ER (MYRBETRIQ) 25 MG TB24 tablet; Take 1 tablet (25 mg total) by mouth daily.  Dispense: 28 tablet; Refill: 0  2. Renal cyst I explained that her CT findings of parapelvic cysts represent benign findings and do not require follow-up.  She expressed understanding.  3. Pelvicaliectasis I explained that this is also likely a benign finding, however I will obtain serum creatinine today to assess her renal function and establish new baseline given that her most recent available creatinine is 54 years old. - Creatinine, serum  Return in about 4 weeks (around 05/08/2020) for Symptom recheck with PVR with Dr. Apolinar Junes.  Carman Ching, PA-C  [facility] [address], [address] [place], [place] [address] [phone]

## 2020-04-11 ENCOUNTER — Ambulatory Visit: Payer: Self-pay | Admitting: Physician Assistant

## 2020-04-11 LAB — CREATININE, SERUM
Creatinine, Ser: 0.72 mg/dL (ref 0.57–1.00)
GFR calc Af Amer: 108 mL/min/{1.73_m2} (ref >59)
GFR calc non Af Amer: 94 mL/min/{1.73_m2} (ref >59)

## 2020-04-11 LAB — URINALYSIS, COMPLETE
Bilirubin, UA: NEGATIVE
Glucose, UA: NEGATIVE
Ketones, UA: NEGATIVE
Nitrite, UA: NEGATIVE
Protein,UA: NEGATIVE
Specific Gravity, UA: 1.015 (ref 1.005–1.030)
Urobilinogen, Ur: 0.2 mg/dL (ref 0.2–1.0)
pH, UA: 6.5 (ref 5.0–7.5)

## 2020-04-11 LAB — MICROSCOPIC EXAMINATION
Bacteria, UA: NONE SEEN
Epithelial Cells (non renal): >10 /hpf — AB (ref 0–10)

## 2020-04-17 ENCOUNTER — Ambulatory Visit: Payer: Self-pay | Admitting: Urology

## 2020-05-14 ENCOUNTER — Ambulatory Visit: Payer: Self-pay | Admitting: Urology

## 2020-05-14 ENCOUNTER — Encounter: Payer: Self-pay | Admitting: Urology

## 2020-05-14 ENCOUNTER — Other Ambulatory Visit: Payer: Self-pay | Admitting: *Deleted

## 2020-05-14 DIAGNOSIS — N3944 Nocturnal enuresis: Secondary | ICD-10-CM

## 2020-05-14 MED ORDER — MIRABEGRON ER 25 MG PO TB24
25.0000 mg | ORAL_TABLET | Freq: Every day | ORAL | 0 refills | Status: DC
Start: 2020-05-14 — End: 2020-06-05

## 2020-05-15 ENCOUNTER — Other Ambulatory Visit: Payer: Self-pay | Admitting: *Deleted

## 2020-05-15 NOTE — Progress Notes (Signed)
MYRBETRIQ  CALLED IN [facility]

## 2020-06-05 ENCOUNTER — Other Ambulatory Visit: Payer: Self-pay | Admitting: Urology

## 2020-06-05 DIAGNOSIS — N3944 Nocturnal enuresis: Secondary | ICD-10-CM

## 2020-06-11 NOTE — Progress Notes (Signed)
06/12/2020 2:21 PM   Vanessa Huffman  [birth year] [id number]  Referring provider: [facility] [address] [place],  [place] [address] Chief Complaint  Patient presents with  . Nocturnal Enuresis    HPI: Vanessa Huffman is a 54 y.o. female who presents today 3 month follow up of acute cystitis, IC and pelvic floor dysfunction.   She was a previous patient of Dr. Lonna Cobb for microscopic hematuria and pelvic pain last seen in 2015.    CT hematuria on 09/05/2013 at [facility] showed small left parapelvic cyst, otherwise unremarkable.   Patient saw Carman Ching, PA-C on 04/10/2020 for occasional nocturnal enuresis. She had nocturia x 1 and urinary urgency. She had episodes of urge incontinence or insensate leakage overnight. Patient was placed on Myrbetriq 25 mg.   She reports nocturnal enuresis a few years back due to stress. She wears heavy duty pads. Night time symptoms are worse on low dose Mrybetriq. He nocturia has increased from 1 to 3 times nightly. She feels like Mrybetriq makes her thirsty therefore she has increased her fluid intake. Daytime symptoms did however improve.   PVR is 131 mL.   The patient has not yet reached out the her PCP for a sleep study test.  She does snore at nighttime.  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    No personal history of cancer.   PMH: Past Medical History:  Diagnosis Date  . Anxiety   . Hypertension     Surgical History: Past Surgical History:  Procedure Laterality Date  . ABDOMINAL HYSTERECTOMY    . APPENDECTOMY    . CHOLECYSTECTOMY      Home Medications:  Allergies as of 06/12/2020      Reactions   Acetaminophen-codeine Other (See Comments)   Lethargy   Codeine Other (See Comments)      Medication List       Accurate as of June 12, 2020  2:21 PM. If you have any questions, ask your nurse or doctor.        STOP taking these  medications   sulfamethoxazole-trimethoprim 800-160 MG tablet Commonly known as: BACTRIM DS Stopped by: Vanna Scotland, MD     TAKE these medications   amitriptyline 100 MG tablet Commonly known as: ELAVIL Take 100 mg by mouth at bedtime. What changed: Another medication with the same name was removed. Continue taking this medication, and follow the directions you see here. Changed by: Vanna Scotland, MD   atorvastatin 40 MG tablet Commonly known as: LIPITOR Take 40 mg by mouth at bedtime.   buPROPion 150 MG 24 hr tablet Commonly known as: WELLBUTRIN XL Take 150 mg by mouth every morning.   escitalopram 20 MG tablet Commonly known as: LEXAPRO Take 20 mg by mouth daily.   estradiol 0.1 MG/GM vaginal cream Commonly known as: ESTRACE Dispose of applicator. Apply pea size amount to area Monday, Wednesday, Friday evening.   Myrbetriq 25 MG Tb24 tablet Generic drug: mirabegron ER TAKE 1 TABLET BY MOUTH EVERY DAY       Allergies:  Allergies  Allergen Reactions  . Acetaminophen-Codeine Other (See Comments)    Lethargy  . Codeine Other (See Comments)    Family History: Family History  Problem Relation Age of Onset  . Breast cancer Maternal Aunt        2 mat aunts    Social History:  reports that she has never  smoked. She has never used smokeless tobacco. She reports that she does not drink alcohol. No history on file for drug use.   Physical Exam: BP 140/70   Pulse 76   Constitutional:  Alert and oriented, No acute distress. HEENT: [place] AT, moist mucus membranes.  Trachea midline, no masses. Cardiovascular: No clubbing, cyanosis, or edema. Respiratory: Normal respiratory effort, no increased work of breathing. Skin: No rashes, bruises or suspicious lesions. Neurologic: Grossly intact, no focal deficits, moving all 4 extremities. Psychiatric: Normal mood and affect.  Laboratory Data:  Lab Results  Component Value Date   CREATININE 0.72 04/10/2020     Pertinent Imaging: Results for orders placed or performed in visit on 06/12/20  BLADDER SCAN AMB NON-IMAGING  Result Value Ref Range   Scan Result 131 ml      Assessment & Plan:    1. Nocturnal enuresis  Contributing multi factors including increasing her fluid intake due to dry mouth. Myrbetriq 25 mg improved daytime symptoms but nighttime symptoms worsened. Discontinue Myrbetriq especially in the setting of mildly elevated PVR today, likely not a good medication for her Discussed behavorial modifications such as limiting fluids 4 hours before bedtime.  Recommend sleep study with PCP as this may be a contributing factor.   2. IC Continue Elavil Symptoms are stable  3. rUTI Asymptomatic Follow up as needed    Follow up in 3 months with [facility]   [facility] [address], [address] [place], [place] [address] [phone]  I, Theador Hawthorne, am acting as a scribe for Dr. Vanna Scotland.  I have reviewed the above documentation for accuracy and completeness, and I agree with the above.   Vanna Scotland, MD

## 2020-06-12 ENCOUNTER — Ambulatory Visit (INDEPENDENT_AMBULATORY_CARE_PROVIDER_SITE_OTHER): Payer: BC Managed Care – PPO | Admitting: Urology

## 2020-06-12 ENCOUNTER — Other Ambulatory Visit: Payer: Self-pay

## 2020-06-12 VITALS — BP 140/70 | HR 76

## 2020-06-12 DIAGNOSIS — N3944 Nocturnal enuresis: Secondary | ICD-10-CM | POA: Diagnosis not present

## 2020-06-12 LAB — BLADDER SCAN AMB NON-IMAGING: Scan Result: 131

## 2020-06-20 ENCOUNTER — Encounter: Payer: Self-pay | Admitting: Urology

## 2020-06-20 MED ORDER — OXYBUTYNIN CHLORIDE ER 15 MG PO TB24: 15.0000 mg | ORAL_TABLET | Freq: Every day | ORAL | 0 refills | Status: DC

## 2020-06-20 NOTE — Telephone Encounter (Signed)
You can try oxybutynin 15 mg XL daily but may cause issues with constipation, dry eyes, dry mouth is most common side effects.   Vanna Scotland, MD   Patient advised-sent in to pharmacy. Voiced understanding.

## 2020-06-20 NOTE — Telephone Encounter (Signed)
Called patient to discuss symptoms, patient has been having increased incontinence. Patient has not been able to get sleep study with his PCP due to her schedule. Would like to know if there is something other than Myrbetriq to try in the interim.

## 2020-07-02 ENCOUNTER — Encounter: Payer: Self-pay | Admitting: Urology

## 2020-07-03 ENCOUNTER — Ambulatory Visit (INDEPENDENT_AMBULATORY_CARE_PROVIDER_SITE_OTHER): Payer: BC Managed Care – PPO | Admitting: Physician Assistant

## 2020-07-03 ENCOUNTER — Encounter: Payer: Self-pay | Admitting: Physician Assistant

## 2020-07-03 ENCOUNTER — Other Ambulatory Visit: Payer: Self-pay

## 2020-07-03 DIAGNOSIS — N390 Urinary tract infection, site not specified: Secondary | ICD-10-CM | POA: Diagnosis not present

## 2020-07-03 LAB — BLADDER SCAN AMB NON-IMAGING: Scan Result: 45

## 2020-07-03 MED ORDER — SULFAMETHOXAZOLE-TRIMETHOPRIM 800-160 MG PO TABS: 1.0000 | ORAL_TABLET | Freq: Two times a day (BID) | ORAL | 0 refills | Status: AC

## 2020-07-03 NOTE — Progress Notes (Signed)
07/03/2020 9:23 AM   Vanessa Huffman [birth year] [id number]  CC: Chief Complaint  Patient presents with  . Urinary Tract Infection    HPI: Vanessa Huffman is a 54 y.o. female with PMH IC on Elavil, recurrent UTI on topical vaginal estrogen cream and daily probiotics, pelvic floor dysfunction, and nocturnal enuresis who presents today for evaluation of possible UTI.  She was seen most recently in clinic by Dr. Apolinar Junes on 06/12/2020 for symptom recheck on Myrbetriq 25 mg daily.  At that time, she reported increased nocturnal enuresis and was switched from Myrbetriq to oxybutynin XL 15 mg daily.  Additionally, she was recommended to undergo sleep study.  Today she reports an approximate 1 week history of dysuria, cloudy urine, and malodorous urine.  She took Azo 2 days ago for management of her symptoms.  She continues to use topical vaginal estrogen cream every other night and daily probiotics.  She has deferred cranberry supplements due to her IC.  In-office UA today positive for trace ketones, 1+ blood, nitrites, and 2+ leukocyte esterase; urine microscopy with >30 WBCs/HPF, 3-10 RBCs/HPF, and many bacteria. PVR 20mL.  PMH: Past Medical History:  Diagnosis Date  . Anxiety   . Hypertension     Surgical History: Past Surgical History:  Procedure Laterality Date  . ABDOMINAL HYSTERECTOMY    . APPENDECTOMY    . CHOLECYSTECTOMY      Home Medications:  Allergies as of 07/03/2020      Reactions   Acetaminophen-codeine Other (See Comments)   Lethargy   Codeine Other (See Comments)      Medication List       Accurate as of July 03, 2020  9:23 AM. If you have any questions, ask your nurse or doctor.        STOP taking these medications   Myrbetriq 25 MG Tb24 tablet Generic drug: mirabegron ER Stopped by: Carman Ching, PA-C     TAKE these medications   amitriptyline 100 MG tablet Commonly known as: ELAVIL Take 100 mg by mouth at bedtime.     atorvastatin 40 MG tablet Commonly known as: LIPITOR Take 40 mg by mouth at bedtime.   buPROPion 150 MG 24 hr tablet Commonly known as: WELLBUTRIN XL Take 150 mg by mouth every morning.   escitalopram 20 MG tablet Commonly known as: LEXAPRO Take 20 mg by mouth daily.   estradiol 0.1 MG/GM vaginal cream Commonly known as: ESTRACE Dispose of applicator. Apply pea size amount to area Monday, Wednesday, Friday evening.   oxybutynin 15 MG 24 hr tablet Commonly known as: DITROPAN XL Take 1 tablet (15 mg total) by mouth daily.       Allergies:  Allergies  Allergen Reactions  . Acetaminophen-Codeine Other (See Comments)    Lethargy  . Codeine Other (See Comments)    Family History: Family History  Problem Relation Age of Onset  . Breast cancer Maternal Aunt        2 mat aunts    Social History:   reports that she has never smoked. She has never used smokeless tobacco. She reports that she does not drink alcohol. No history on file for drug use.  Physical Exam: BP 132/82   Pulse 76   Ht 5\' 6"  (1.676 m)   BMI 34.70 kg/m   Constitutional:  Alert and oriented, no acute distress, nontoxic appearing HEENT: [place], AT Cardiovascular: No clubbing, cyanosis, or edema Respiratory: Normal respiratory effort, no increased work of breathing Skin: No rashes, bruises  or suspicious lesions Neurologic: Grossly intact, no focal deficits, moving all 4 extremities Psychiatric: Normal mood and affect  Laboratory Data: Results for orders placed or performed in visit on 07/03/20  Microscopic Examination   Urine  Result Value Ref Range   WBC, UA >30 (A) 0 - 5 /hpf   RBC 3-10 (A) 0 - 2 /hpf   Epithelial Cells (non renal) 0-10 0 - 10 /hpf   Renal Epithel, UA 0-10 (A) None seen /hpf   Bacteria, UA Many (A) None seen/Few  Urinalysis, Complete  Result Value Ref Range   Specific Gravity, UA 1.020 1.005 - 1.030   pH, UA 7.0 5.0 - 7.5   Color, UA Yellow Yellow   Appearance Ur Cloudy (A)  Clear   Leukocytes,UA 2+ (A) Negative   Protein,UA Negative Negative/Trace   Glucose, UA Negative Negative   Ketones, UA Negative Negative   RBC, UA 1+ (A) Negative   Bilirubin, UA Negative Negative   Urobilinogen, Ur 0.2 0.2 - 1.0 mg/dL   Nitrite, UA Positive (A) Negative   Microscopic Examination See below:   Bladder Scan (Post Void Residual) in office  Result Value Ref Range   Scan Result 45    Assessment & Plan:   1. Recurrent UTI UA grossly infected today, nitrites not likely to be contamination given most recent Azo use 2 days ago.  Will start empiric Bactrim and send for culture for further evaluation.  Counseled patient to continue topical vaginal estrogen cream and probiotics for UTI prevention.  She expressed understanding.  We will plan for repeat UA in 1 week to prove resolution of MH. - Urinalysis, Complete - Bladder Scan (Post Void Residual) in office - CULTURE, URINE COMPREHENSIVE - sulfamethoxazole-trimethoprim (BACTRIM DS) 800-160 MG tablet; Take 1 tablet by mouth 2 (two) times daily for 5 days.  Dispense: 10 tablet; Refill: 0   Return in about 1 week (around 07/10/2020) for Lab visit for UA.  Carman Ching, PA-C  [facility] [address], [address] [place], [place] [address] [phone]

## 2020-07-04 LAB — URINALYSIS, COMPLETE
Bilirubin, UA: NEGATIVE
Glucose, UA: NEGATIVE
Ketones, UA: NEGATIVE
Nitrite, UA: POSITIVE — AB
Protein,UA: NEGATIVE
Specific Gravity, UA: 1.02 (ref 1.005–1.030)
Urobilinogen, Ur: 0.2 mg/dL (ref 0.2–1.0)
pH, UA: 7 (ref 5.0–7.5)

## 2020-07-04 LAB — MICROSCOPIC EXAMINATION: WBC, UA: >30 /hpf — AB (ref 0–5)

## 2020-07-05 ENCOUNTER — Other Ambulatory Visit: Payer: Self-pay | Admitting: Family Medicine

## 2020-07-05 DIAGNOSIS — N390 Urinary tract infection, site not specified: Secondary | ICD-10-CM

## 2020-07-05 LAB — CULTURE, URINE COMPREHENSIVE

## 2020-07-06 LAB — CULTURE, URINE COMPREHENSIVE

## 2020-07-10 ENCOUNTER — Other Ambulatory Visit: Payer: BC Managed Care – PPO

## 2020-07-10 ENCOUNTER — Other Ambulatory Visit: Payer: Self-pay

## 2020-07-10 DIAGNOSIS — N390 Urinary tract infection, site not specified: Secondary | ICD-10-CM

## 2020-07-12 ENCOUNTER — Other Ambulatory Visit: Payer: Self-pay | Admitting: Urology

## 2020-07-16 LAB — URINALYSIS, COMPLETE
Bilirubin, UA: NEGATIVE
Glucose, UA: NEGATIVE
Ketones, UA: NEGATIVE
Leukocytes,UA: NEGATIVE
Nitrite, UA: NEGATIVE
Specific Gravity, UA: 1.025 (ref 1.005–1.030)
Urobilinogen, Ur: 0.2 mg/dL (ref 0.2–1.0)
pH, UA: 6 (ref 5.0–7.5)

## 2020-07-16 LAB — MICROSCOPIC EXAMINATION

## 2020-09-18 ENCOUNTER — Encounter: Payer: Self-pay | Admitting: Urology

## 2020-09-18 ENCOUNTER — Ambulatory Visit: Payer: BC Managed Care – PPO | Admitting: Urology

## 2020-09-18 ENCOUNTER — Other Ambulatory Visit: Payer: Self-pay

## 2020-09-18 VITALS — BP 122/80 | HR 100 | Ht 66.0 in | Wt 214.0 lb

## 2020-09-18 DIAGNOSIS — N3010 Interstitial cystitis (chronic) without hematuria: Secondary | ICD-10-CM | POA: Diagnosis not present

## 2020-09-18 DIAGNOSIS — R1310 Dysphagia, unspecified: Secondary | ICD-10-CM

## 2020-09-18 DIAGNOSIS — N390 Urinary tract infection, site not specified: Secondary | ICD-10-CM | POA: Diagnosis not present

## 2020-09-18 DIAGNOSIS — N3944 Nocturnal enuresis: Secondary | ICD-10-CM

## 2020-09-18 LAB — BLADDER SCAN AMB NON-IMAGING

## 2020-09-18 NOTE — Progress Notes (Signed)
09/18/2020 2:39 PM   Vanessa Huffman [birth year] [id number]  Referring provider: [facility] [address] [place],  [place] [address]  Chief Complaint  Patient presents with  . Nocturia    HPI: 55 year old female with a history of recurrent UTIs, IC and OAB who presents today for routine follow-up.  Notably she was seen in November by her PA at which time she was having symptoms consistent with UTI.  Her urine was frankly positive.  Interestingly, urine culture was negative.  Her symptoms resolved with him to biotics.  Today, she reports that the oxybutynin is working extremely well.  She has significantly decreased urgency frequency and incontinence.  She has no side effects from this medication.  She is chronically constipated and this is unchanged since taking this medication.  She is now taking MiraLAX and Metamucil daily to help with this and keep her regular.  She mentions today that her urine is foul-smelling like when she has an infection.  She is no other urinary symptoms including no dysuria, gross hematuria.  Urinalysis today does not fact show 11-30 white blood cells per high-power field, nitrate positive and many bacteria.  There is no red blood cells.  She is also being managed with Elavil which she has been taking chronically for her IC.  Her symptoms are stable on this.  She is using topical estrogen cream for UTI prevention.  She also mentions a new symptom today that she has been experiencing over the last month.  She reports difficulty swallowing.  She sometimes will have something caught in her esophagus.  When this happens, its uncomfortable and she does not want to eat anymore.  Takes several hours for this to resolve.  She denies any reflux.  If she does burp, the food will come up.  This is a new problem.  She is not seeing gastroenterology.    PMH: Past Medical History:  Diagnosis Date  . Anxiety   . Hypertension     Surgical  History: Past Surgical History:  Procedure Laterality Date  . ABDOMINAL HYSTERECTOMY    . APPENDECTOMY    . CHOLECYSTECTOMY      Home Medications:  Allergies as of 09/18/2020      Reactions   Acetaminophen-codeine Other (See Comments)   Lethargy   Codeine Other (See Comments)      Medication List       Accurate as of September 18, 2020  2:39 PM. If you have any questions, ask your nurse or doctor.        amitriptyline 100 MG tablet Commonly known as: ELAVIL Take 100 mg by mouth at bedtime.   atorvastatin 40 MG tablet Commonly known as: LIPITOR Take 40 mg by mouth at bedtime.   buPROPion 150 MG 24 hr tablet Commonly known as: WELLBUTRIN XL Take 150 mg by mouth every morning.   escitalopram 20 MG tablet Commonly known as: LEXAPRO Take 20 mg by mouth daily.   estradiol 0.1 MG/GM vaginal cream Commonly known as: ESTRACE Dispose of applicator. Apply pea size amount to area Monday, Wednesday, Friday evening.   oxybutynin 15 MG 24 hr tablet Commonly known as: DITROPAN XL TAKE 1 TABLET BY MOUTH EVERY DAY       Allergies:  Allergies  Allergen Reactions  . Acetaminophen-Codeine Other (See Comments)    Lethargy  . Codeine Other (See Comments)    Family History: Family History  Problem Relation Age of Onset  . Breast cancer Maternal Aunt  2 mat aunts    Social History:  reports that she has never smoked. She has never used smokeless tobacco. She reports that she does not drink alcohol. No history on file for drug use.   Physical Exam: BP 122/80   Pulse 100   Ht 5\' 6"  (1.676 m)   Wt 214 lb (97.1 kg)   BMI 34.54 kg/m   Constitutional:  Alert and oriented, No acute distress. HEENT: [place] AT, moist mucus membranes.  Trachea midline, no masses. Cardiovascular: No clubbing, cyanosis, or edema. Respiratory: Normal respiratory effort, no increased work of breathing. Skin: No rashes, bruises or suspicious lesions. Neurologic: Grossly intact, no focal  deficits, moving all 4 extremities. Psychiatric: Normal mood and affect.  Laboratory Data: Lab Results  Component Value Date   WBC 7.4 07/29/2013   HGB 12.0 07/29/2013   HCT 37.0 07/29/2013   MCV 89 07/29/2013   PLT 289 07/29/2013    Lab Results  Component Value Date   CREATININE 0.72 04/10/2020   Results for orders placed or performed in visit on 09/18/20  Bladder Scan (Post Void Residual) in office  Result Value Ref Range   Scan Result 12mL     Assessment & Plan:    1. Nocturnal enuresis Improved/resolved on oxybutynin 15 mg XL  Adequate emptying, will continue this medication - Bladder Scan (Post Void Residual) in office  2. Recurrent UTI vs. Chronic bacterial colonization Only urinary symptom today is foul-smelling urine although urine appears frankly positive again today  We discussed indications for treatment of UTIs including bladder specific symptoms, dysuria, or other systemic signs of infection.  Will not treat for foul-smelling urine only, this is suggestive of possible chronic bacterial colonization versus untreated infection.  We will send a urine culture today for academic versus only treat if she develops symptoms.  She is agreeable this plan and is understandable.  Continue topical estrogen cream - Urinalysis, Complete - CULTURE, URINE COMPREHENSIVE  3. Interstitial cystitis Continue Elavil, symptoms stable  4. Dysphagia, unspecified type New problem, discussed differential diagnosis.  Requesting gastroenterology referral. - Ambulatory referral to Gastroenterology   F/u 1 year or sooner as needed with UA/ PVR/ symptom recheck  19m, MD  [facility] [address], [address] [place], [place] [phone]

## 2020-09-19 LAB — URINALYSIS, COMPLETE
Bilirubin, UA: NEGATIVE
Glucose, UA: NEGATIVE
Ketones, UA: NEGATIVE
Nitrite, UA: POSITIVE — AB
Protein,UA: NEGATIVE
Specific Gravity, UA: 1.01 (ref 1.005–1.030)
Urobilinogen, Ur: 0.2 mg/dL (ref 0.2–1.0)
pH, UA: 6.5 (ref 5.0–7.5)

## 2020-09-19 LAB — MICROSCOPIC EXAMINATION

## 2020-09-23 ENCOUNTER — Encounter: Payer: Self-pay | Admitting: Urology

## 2020-09-23 LAB — CULTURE, URINE COMPREHENSIVE

## 2020-09-23 NOTE — Telephone Encounter (Signed)
Called patient to discuss-denies any symptoms-only foul smelling odor. Denies burning, frequency, nausea, vomiting or pain. Per last visit if no change in symptoms no need to treat for chronic colonization. Voiced understanding.

## 2021-07-07 ENCOUNTER — Other Ambulatory Visit: Payer: Self-pay | Admitting: Otolaryngology

## 2021-07-07 DIAGNOSIS — R1310 Dysphagia, unspecified: Secondary | ICD-10-CM

## 2021-07-08 ENCOUNTER — Other Ambulatory Visit: Payer: Self-pay | Admitting: Urology

## 2021-07-15 ENCOUNTER — Ambulatory Visit
Admission: RE | Admit: 2021-07-15 | Discharge: 2021-07-15 | Disposition: A | Discharge status: Home / Self Care | Payer: BC Managed Care – PPO | Source: Ambulatory Visit | Attending: Otolaryngology | Admitting: Otolaryngology

## 2021-07-15 ENCOUNTER — Other Ambulatory Visit: Payer: Self-pay

## 2021-07-15 DIAGNOSIS — R1310 Dysphagia, unspecified: Secondary | ICD-10-CM | POA: Insufficient documentation

## 2021-09-23 ENCOUNTER — Ambulatory Visit: Payer: Self-pay | Admitting: Urology

## 2021-09-25 NOTE — Progress Notes (Signed)
09/26/21 12:37 PM   Vanessa Huffman Sep 10, 1965 [id number]  Referring provider:  [facility] [address] [place],  [place] [address] Chief Complaint  Patient presents with   Nocturnal Enuresis     HPI: Vanessa Huffman is a 56 y.o.female with a personal history of recurrent UTIs vs. Chronic bacterial colonization, nocturnal enuresis, dysphagia, IC and OAB , who presents today for 1 year follow-up with PVR and UA .   She is using topical estrogen cream for UTI prevention.She is also being managed with Elavil which she has been taking chronically for her IC.   Today, she reports her symptoms have been stable.  She thinks oxybutynin 15 mg XL is working very well for her.  She in fact reports today that sometimes, she will only void once or twice during the daytime.  She does report that she is really busy and probably is not drinking enough water.  She denies any significant incontinence.  No recent flares of her IC.  No recent pelvic pain.  She believes the Elavil is helping her.   PMH: Past Medical History:  Diagnosis Date   Anxiety    Hypertension     Surgical History: Past Surgical History:  Procedure Laterality Date   ABDOMINAL HYSTERECTOMY     APPENDECTOMY     CHOLECYSTECTOMY      Home Medications:  Allergies as of 09/26/2021       Reactions   Acetaminophen-codeine Other (See Comments)   Lethargy   Codeine Other (See Comments)        Medication List        Accurate as of September 26, 2021 12:37 PM. If you have any questions, ask your nurse or doctor.          amitriptyline 100 MG tablet Commonly known as: ELAVIL Take 1 tablet (100 mg total) by mouth at bedtime.   atorvastatin 40 MG tablet Commonly known as: LIPITOR Take 40 mg by mouth at bedtime.   buPROPion 150 MG 24 hr tablet Commonly known as: WELLBUTRIN XL Take 150 mg by mouth every morning.   escitalopram 20 MG tablet Commonly known as: LEXAPRO Take 20 mg by  mouth daily.   estradiol 0.1 MG/GM vaginal cream Commonly known as: ESTRACE Dispose of applicator. Apply pea size amount to area Monday, Wednesday, Friday evening.   oxybutynin 15 MG 24 hr tablet Commonly known as: DITROPAN XL TAKE 1 TABLET BY MOUTH EVERY DAY   triamcinolone 55 MCG/ACT Aero nasal inhaler Commonly known as: NASACORT 2 sprays daily.        Allergies:  Allergies  Allergen Reactions   Acetaminophen-Codeine Other (See Comments)    Lethargy   Codeine Other (See Comments)    Family History: Family History  Problem Relation Age of Onset   Breast cancer Maternal Aunt        2 mat aunts    Social History:  reports that she has never smoked. She has never used smokeless tobacco. She reports that she does not drink alcohol. No history on file for drug use.   Physical Exam: BP 132/85 (BP Location: Left Arm, Patient Position: Sitting, Cuff Size: Large)    Pulse 93    Ht 5\' 5"  (1.651 m)    Wt 220 lb (99.8 kg)    BMI 36.61 kg/m   Constitutional:  Alert and oriented, No acute distress. HEENT: [place] AT, moist mucus membranes.  Trachea midline, no masses. Skin: No rashes, bruises or suspicious  lesions. Neurologic: Grossly intact, no focal deficits, moving all 4 extremities. Psychiatric: Normal mood and affect.  Laboratory Data: Lab Results  Component Value Date   CREATININE 0.72 04/10/2020    Urinalysis Results for orders placed or performed during the hospital encounter of 09/26/21  Urinalysis, Complete w Microscopic  Result Value Ref Range   Color, Urine YELLOW YELLOW   APPearance CLEAR CLEAR   Specific Gravity, Urine 1.015 1.005 - 1.030   pH 7.0 5.0 - 8.0   Glucose, UA NEGATIVE NEGATIVE mg/dL   Hgb urine dipstick TRACE (A) NEGATIVE   Bilirubin Urine NEGATIVE NEGATIVE   Ketones, ur NEGATIVE NEGATIVE mg/dL   Protein, ur NEGATIVE NEGATIVE mg/dL   Nitrite NEGATIVE NEGATIVE   Leukocytes,Ua SMALL (A) NEGATIVE   Squamous Epithelial / LPF 0-5 0 - 5   WBC, UA  6-10 0 - 5 WBC/hpf   RBC / HPF 6-10 0 - 5 RBC/hpf   Bacteria, UA FEW (A) NONE SEEN   Mucus PRESENT   Results for orders placed or performed in visit on 09/26/21  Bladder Scan (Post Void Residual) in office  Result Value Ref Range   Scan Result 41mL     Assessment & Plan:    1. Nocturnal enuresis Continue oxybutynin 15 mg XL Adequate bladder emptying today No side effect from this medication Did encourage her to drink more fluids during the daytime, suspect she may be dehydrated at times - Bladder Scan (Post Void Residual) in office - amitriptyline (ELAVIL) 100 MG tablet; Take 1 tablet (100 mg total) by mouth at bedtime.  Dispense: 30 tablet; Refill: 11  2. Interstitial cystitis No pelvic pain or flares on Elavil, continue this medication - amitriptyline (ELAVIL) 100 MG tablet; Take 1 tablet (100 mg total) by mouth at bedtime.  Dispense: 30 tablet; Refill: 11  3.  Recurrent UTIs Continues to do well on topical estrogen cream continue this indefinitely  Return in about 1 year (around 09/26/2022) for follow up.  Vanessa Huffman as a Neurosurgeon for Vanessa Scotland, MD.,have documented all relevant documentation on the behalf of Vanessa Scotland, MD,as directed by  Vanessa Scotland, MD while in the presence of Vanessa Scotland, MD.  I have reviewed the above documentation for accuracy and completeness, and I agree with the above.   Vanessa Scotland, MD  [facility] [address] [place], [place] [address] [phone]

## 2021-09-26 ENCOUNTER — Other Ambulatory Visit
Admission: RE | Admit: 2021-09-26 | Discharge: 2021-09-26 | Disposition: A | Discharge status: Home / Self Care | Payer: BC Managed Care – PPO | Attending: Urology | Admitting: Urology

## 2021-09-26 ENCOUNTER — Ambulatory Visit: Payer: BC Managed Care – PPO | Admitting: Urology

## 2021-09-26 ENCOUNTER — Encounter: Payer: Self-pay | Admitting: Urology

## 2021-09-26 ENCOUNTER — Other Ambulatory Visit: Payer: Self-pay | Admitting: *Deleted

## 2021-09-26 ENCOUNTER — Other Ambulatory Visit: Payer: Self-pay

## 2021-09-26 VITALS — BP 132/85 | HR 93 | Ht 65.0 in | Wt 220.0 lb

## 2021-09-26 DIAGNOSIS — N3010 Interstitial cystitis (chronic) without hematuria: Secondary | ICD-10-CM | POA: Diagnosis not present

## 2021-09-26 DIAGNOSIS — N390 Urinary tract infection, site not specified: Secondary | ICD-10-CM | POA: Diagnosis present

## 2021-09-26 DIAGNOSIS — N3944 Nocturnal enuresis: Secondary | ICD-10-CM | POA: Diagnosis not present

## 2021-09-26 LAB — URINALYSIS, COMPLETE (UACMP) WITH MICROSCOPIC
Bilirubin Urine: NEGATIVE
Glucose, UA: NEGATIVE mg/dL
Ketones, ur: NEGATIVE mg/dL
Nitrite: NEGATIVE
Protein, ur: NEGATIVE mg/dL
Specific Gravity, Urine: 1.015 (ref 1.005–1.030)
pH: 7 (ref 5.0–8.0)

## 2021-09-26 LAB — BLADDER SCAN AMB NON-IMAGING

## 2021-09-26 MED ORDER — AMITRIPTYLINE HCL 100 MG PO TABS: 100.0000 mg | ORAL_TABLET | Freq: Every day | ORAL | 11 refills | Status: DC

## 2021-10-03 ENCOUNTER — Encounter: Payer: Self-pay | Admitting: Urology

## 2021-10-03 DIAGNOSIS — N390 Urinary tract infection, site not specified: Secondary | ICD-10-CM

## 2021-10-06 NOTE — Telephone Encounter (Signed)
Spoke with pt. She is scheduled in [place] on Friday. Pt aware to go to lab first.

## 2021-10-09 NOTE — Progress Notes (Signed)
10/10/21 11:46 AM   Vanessa Huffman 21-Oct-1965 [id number]  Referring provider:  [facility] [address] [place],  [place] [address] Chief Complaint  Patient presents with   Recurrent UTI     HPI: Vanessa Huffman is a 56 y.o.female with a personal history of recurrent UTIs vs. Chronic bacterial colonization, nocturnal enuresis, dysphagia, IC and OAB , who presents today for further evaluation of possible UTI and incontinence.   She is using topical estrogen cream for UTI prevention indefinitely. She is also being managed with Elavil which she has been taking chronically for her IC.   She was seen earlier this month for nocturnal enuresis which is managed on oxybutynin.   She reports that her incontinence has worsened. She saw me fairly recently and at that time was doing well but, had 3 episodes of nighttime enuresis in 1 week and feels like the oxybutynin may no longer be helping.    She denies constipation, liquid intake before bedtime or increased alcohol intake. She has had increased stress and is on anxiety and depression medications.  PMH: Past Medical History:  Diagnosis Date   Anxiety    Hypertension     Surgical History: Past Surgical History:  Procedure Laterality Date   ABDOMINAL HYSTERECTOMY     APPENDECTOMY     CHOLECYSTECTOMY      Home Medications:  Allergies as of 10/10/2021       Reactions   Acetaminophen-codeine Other (See Comments)   Lethargy   Codeine Other (See Comments)        Medication List        Accurate as of October 10, 2021 11:46 AM. If you have any questions, ask your nurse or doctor.          amitriptyline 100 MG tablet Commonly known as: ELAVIL Take 1 tablet (100 mg total) by mouth at bedtime.   atorvastatin 40 MG tablet Commonly known as: LIPITOR Take 40 mg by mouth at bedtime.   buPROPion 150 MG 24 hr tablet Commonly known as: WELLBUTRIN XL Take 150 mg by mouth every morning.    escitalopram 20 MG tablet Commonly known as: LEXAPRO Take 20 mg by mouth daily.   estradiol 0.1 MG/GM vaginal cream Commonly known as: ESTRACE Dispose of applicator. Apply pea size amount to area Monday, Wednesday, Friday evening.   oxybutynin 15 MG 24 hr tablet Commonly known as: DITROPAN XL TAKE 1 TABLET BY MOUTH EVERY DAY   triamcinolone 55 MCG/ACT Aero nasal inhaler Commonly known as: NASACORT 2 sprays daily.        Allergies:  Allergies  Allergen Reactions   Acetaminophen-Codeine Other (See Comments)    Lethargy   Codeine Other (See Comments)    Family History: Family History  Problem Relation Age of Onset   Breast cancer Maternal Aunt        2 mat aunts    Social History:  reports that she has never smoked. She has never used smokeless tobacco. She reports that she does not drink alcohol. No history on file for drug use.   Physical Exam: BP 127/83    Pulse 94    Ht 5\' 5"  (1.651 m)    Wt 220 lb (99.8 kg)    BMI 36.61 kg/m   Constitutional:  Alert and oriented, No acute distress. HEENT: [place] AT, moist mucus membranes.  Trachea midline, no masses. Cardiovascular: No clubbing, cyanosis, or edema. Respiratory: Normal respiratory effort, no increased work of breathing. Skin:  No rashes, bruises or suspicious lesions. Neurologic: Grossly intact, no focal deficits, moving all 4 extremities. Psychiatric: Normal mood and affect.  Laboratory Data: Lab Results  Component Value Date   CREATININE 0.72 04/10/2020    Urinalysis Component     Latest Ref Rng & Units 10/10/2021  Color, Urine     YELLOW YELLOW  Appearance     CLEAR CLEAR  Specific Gravity, Urine     1.005 - 1.030 1.010  pH     5.0 - 8.0 6.0  Glucose, UA     NEGATIVE mg/dL NEGATIVE  Hgb urine dipstick     NEGATIVE TRACE (A)  Bilirubin Urine     NEGATIVE NEGATIVE  Ketones, ur     NEGATIVE mg/dL NEGATIVE  Protein     NEGATIVE mg/dL NEGATIVE  Nitrite     NEGATIVE NEGATIVE  Leukocytes,Ua      NEGATIVE NEGATIVE  Squamous Epithelial / LPF     0 - 5 11-20  Non Squamous Epithelial     NONE SEEN PRESENT (A)  WBC, UA     0 - 5 WBC/hpf 6-10  RBC / HPF     0 - 5 RBC/hpf 6-10  Bacteria, UA     NONE SEEN FEW (A)  Budding Yeast      PRESENT    Assessment & Plan:    Recurrent UTIs - Will send urine for culture today; it appears similar to her last urinalysis and likely has some degree of contamination that being said in light of her sudden worsening of symptoms, will send urine culture and treat as needed as this may be a possible cause  -We will call her with her culture results -Continues to do well on topical estrogen cream continue this indefinitely  2. Nocturnal enuresis - Previous improvement on oxybutynin 15 mg XL recently has been experiencing increased urinary symptoms.  - We discussed anticholinergics versus Beta 3 agonist such as Myrbetriq or Gemtesa. We will decide change of medications depending on what urine culture grows.  - Vesicare; prescribed  -Agreed to do some back-and-forth regarding medication trials via MyChart.  She also check her insurance formulary to see which is going to be most cost effective.  Return for annual follow-up or sooner as needed  I,Kailey Littlejohn,acting as a scribe for Vanna Scotland, MD.,have documented all relevant documentation on the behalf of Vanna Scotland, MD,as directed by  Vanna Scotland, MD while in the presence of Vanna Scotland, MD.  I have reviewed the above documentation for accuracy and completeness, and I agree with the above.   Vanna Scotland, MD  [facility] [address], [address] [place], [place] [address] [phone]

## 2021-10-10 ENCOUNTER — Other Ambulatory Visit
Admission: RE | Admit: 2021-10-10 | Discharge: 2021-10-10 | Disposition: A | Discharge status: Home / Self Care | Payer: BC Managed Care – PPO | Attending: Urology | Admitting: Urology

## 2021-10-10 ENCOUNTER — Ambulatory Visit: Payer: BC Managed Care – PPO | Admitting: Urology

## 2021-10-10 ENCOUNTER — Encounter: Payer: Self-pay | Admitting: Urology

## 2021-10-10 ENCOUNTER — Other Ambulatory Visit: Payer: Self-pay

## 2021-10-10 VITALS — BP 127/83 | HR 94 | Ht 65.0 in | Wt 220.0 lb

## 2021-10-10 DIAGNOSIS — N390 Urinary tract infection, site not specified: Secondary | ICD-10-CM

## 2021-10-10 LAB — URINALYSIS, COMPLETE (UACMP) WITH MICROSCOPIC
Bilirubin Urine: NEGATIVE
Glucose, UA: NEGATIVE mg/dL
Ketones, ur: NEGATIVE mg/dL
Leukocytes,Ua: NEGATIVE
Nitrite: NEGATIVE
Protein, ur: NEGATIVE mg/dL
Specific Gravity, Urine: 1.01 (ref 1.005–1.030)
pH: 6 (ref 5.0–8.0)

## 2021-10-10 MED ORDER — SOLIFENACIN SUCCINATE 5 MG PO TABS: 5.0000 mg | ORAL_TABLET | Freq: Every day | ORAL | 11 refills | Status: DC

## 2021-10-12 LAB — URINE CULTURE

## 2021-10-13 ENCOUNTER — Encounter: Payer: Self-pay | Admitting: Urology

## 2022-05-28 ENCOUNTER — Ambulatory Visit
Admission: EM | Admit: 2022-05-28 | Discharge: 2022-05-28 | Disposition: A | Discharge status: Home / Self Care | Payer: BC Managed Care – PPO | Attending: Emergency Medicine | Admitting: Emergency Medicine

## 2022-05-28 DIAGNOSIS — J069 Acute upper respiratory infection, unspecified: Secondary | ICD-10-CM

## 2022-05-28 DIAGNOSIS — J40 Bronchitis, not specified as acute or chronic: Secondary | ICD-10-CM

## 2022-05-28 MED ORDER — IPRATROPIUM BROMIDE 0.06 % NA SOLN: 1.0000 | Freq: Three times a day (TID) | NASAL | 12 refills | Status: AC

## 2022-05-28 MED ORDER — BENZONATATE 100 MG PO CAPS: 200.0000 mg | ORAL_CAPSULE | Freq: Three times a day (TID) | ORAL | 0 refills | Status: DC

## 2022-05-28 MED ORDER — ALBUTEROL SULFATE HFA 108 (90 BASE) MCG/ACT IN AERS: 2.0000 | INHALATION_SPRAY | RESPIRATORY_TRACT | 0 refills | Status: DC | PRN

## 2022-05-28 MED ORDER — PROMETHAZINE-DM 6.25-15 MG/5ML PO SYRP: 5.0000 mL | ORAL_SOLUTION | Freq: Four times a day (QID) | ORAL | 0 refills | Status: DC | PRN

## 2022-05-28 MED ORDER — AEROCHAMBER MV MISC: 2 refills | Status: DC

## 2022-05-28 NOTE — ED Triage Notes (Signed)
Pt c/o bad cold onset x1 week ago, deep cough, pt states keeping her up at night. Pt also c/o bilateral eye discharge x7 days

## 2022-05-28 NOTE — ED Provider Notes (Signed)
[facility]    CSN: [id number] Arrival date & time: 05/28/22  0802      History   Chief Complaint Chief Complaint  Patient presents with   Eye Drainage   Cough    HPI Vanessa Huffman is a 56 y.o. female.   HPI  56 year old female here for evaluation respiratory complaints.  Patient reports that for the last week she has been experiencing a low-grade fever that has not been over 100, mild runny nose and nasal congestion, ear pain, scratchy throat, cough that is intermittently productive for yellow sputum, shortness of breath after coughing, and yellow mucousy eye discharge.  She denies any eye itching or photophobia.  No changes in vision.  She does wear contacts.  Past Medical History:  Diagnosis Date   Anxiety    Hypertension     There are no problems to display for this patient.   Past Surgical History:  Procedure Laterality Date   ABDOMINAL HYSTERECTOMY     APPENDECTOMY     CHOLECYSTECTOMY      OB History   No obstetric history on file.      Home Medications    Prior to Admission medications   Medication Sig Start Date End Date Taking? Authorizing Provider  albuterol (VENTOLIN HFA) 108 (90 Base) MCG/ACT inhaler Inhale 2 puffs into the lungs every 4 (four) hours as needed. 05/28/22  Yes Margarette Canada, NP  amitriptyline (ELAVIL) 100 MG tablet Take 1 tablet (100 mg total) by mouth at bedtime. 09/26/21  Yes Hollice Espy, MD  atorvastatin (LIPITOR) 40 MG tablet Take 40 mg by mouth at bedtime. 04/03/20  Yes [provider]  benzonatate (TESSALON) 100 MG capsule Take 2 capsules (200 mg total) by mouth every 8 (eight) hours. 05/28/22  Yes Margarette Canada, NP  buPROPion (WELLBUTRIN XL) 150 MG 24 hr tablet Take 150 mg by mouth every morning. 02/15/20  Yes [provider]  escitalopram (LEXAPRO) 20 MG tablet Take 20 mg by mouth daily.   Yes [provider]  estradiol (ESTRACE) 0.1 MG/GM vaginal cream Dispose of applicator.  Apply pea size amount to area Monday, Wednesday, Friday evening. 03/13/20  Yes Hollice Espy, MD  ipratropium (ATROVENT) 0.06 % nasal spray Place 1 spray into both nostrils 3 (three) times daily. 05/28/22  Yes Margarette Canada, NP  promethazine-dextromethorphan (PROMETHAZINE-DM) 6.25-15 MG/5ML syrup Take 5 mLs by mouth 4 (four) times daily as needed. 05/28/22  Yes Margarette Canada, NP  solifenacin (VESICARE) 5 MG tablet Take 1 tablet (5 mg total) by mouth daily. 10/10/21  Yes Hollice Espy, MD  Spacer/Aero-Holding Chambers (AEROCHAMBER MV) inhaler Use as instructed 05/28/22  Yes Margarette Canada, NP  triamcinolone (NASACORT) 55 MCG/ACT AERO nasal inhaler 2 sprays daily. 09/04/21  Yes [provider]  oxybutynin (DITROPAN XL) 15 MG 24 hr tablet TAKE 1 TABLET BY MOUTH EVERY DAY 07/09/21   Hollice Espy, MD  metoprolol succinate (TOPROL-XL) 25 MG 24 hr tablet Take 25 mg by mouth daily.  01/05/20  [provider]    Family History Family History  Problem Relation Age of Onset   Breast cancer Maternal Aunt        2 mat aunts    Social History Social History   Tobacco Use   Smoking status: Never   Smokeless tobacco: Never  Vaping Use   Vaping Use: Never used  Substance Use Topics   Alcohol use: No     Allergies   Acetaminophen-codeine and Codeine   Review of  Systems Review of Systems  Constitutional:  Positive for fever.  HENT:  Positive for congestion, ear pain, postnasal drip, rhinorrhea and sore throat.   Eyes:  Positive for discharge. Negative for photophobia, pain, redness, itching and visual disturbance.  Respiratory:  Positive for cough, shortness of breath and wheezing.   Skin:  Negative for rash.  Hematological: Negative.   Psychiatric/Behavioral: Negative.       Physical Exam Triage Vital Signs ED Triage Vitals [05/28/22 0824]  Enc Vitals Group     BP      Pulse      Resp      Temp      Temp src      SpO2      Weight 215 lb (97.5 kg)     Height 5'  5" (1.651 m)     Head Circumference      Peak Flow      Pain Score 0     Pain Loc      Pain Edu?      Excl. in [place]?    No data found.  Updated Vital Signs BP 120/78 (BP Location: Left Arm)   Pulse (!) 112   Temp 99 F (37.2 C) (Oral)   Ht 5\' 5"  (1.651 m)   Wt 215 lb (97.5 kg)   SpO2 94%   BMI 35.78 kg/m   Visual Acuity Right Eye Distance: 20/30 Left Eye Distance:  (Pt did not want to attempt) Bilateral Distance: 20/30  Right Eye Near:   Left Eye Near:    Bilateral Near:     Physical Exam Vitals and nursing note reviewed.  Constitutional:      Appearance: Normal appearance. She is not ill-appearing.  HENT:     Head: Normocephalic and atraumatic.     Right Ear: Tympanic membrane, ear canal and external ear normal. There is no impacted cerumen.     Left Ear: Tympanic membrane, ear canal and external ear normal. There is no impacted cerumen.     Nose: Congestion and rhinorrhea present.     Comments: Nasal mucosa is erythematous and edematous with clear rhinorrhea in both nares.    Mouth/Throat:     Mouth: Mucous membranes are moist.     Pharynx: Oropharynx is clear. Posterior oropharyngeal erythema present. No oropharyngeal exudate.     Comments: Oropharyngeal exam reveals mild posterior oropharyngeal erythema and clear postnasal drip.  No injection or exudate noted. Eyes:     General: No scleral icterus.       Right eye: No discharge.        Left eye: No discharge.     Extraocular Movements: Extraocular movements intact.     Conjunctiva/sclera: Conjunctivae normal.     Pupils: Pupils are equal, round, and reactive to light.     Comments: Bulbar and labral conjunctiva are free of erythema or injection.  No discharge noted in the lashes, on the globe, or in either inner or outer canthus.  Cardiovascular:     Rate and Rhythm: Normal rate and regular rhythm.     Pulses: Normal pulses.     Heart sounds: Normal heart sounds. No murmur heard.    No friction rub. No  gallop.  Pulmonary:     Effort: Pulmonary effort is normal.     Breath sounds: Rhonchi present. No wheezing or rales.     Comments: Rhonchi in all lung fields. Musculoskeletal:     Cervical back: Normal range of motion and neck supple.  Lymphadenopathy:     Cervical: No cervical adenopathy.  Skin:    General: Skin is warm and dry.     Capillary Refill: Capillary refill takes less than 2 seconds.     Findings: No erythema or rash.  Neurological:     General: No focal deficit present.     Mental Status: She is alert and oriented to person, place, and time.  Psychiatric:        Mood and Affect: Mood normal.        Behavior: Behavior normal.        Thought Content: Thought content normal.        Judgment: Judgment normal.      UC Treatments / Results  Labs (all labs ordered are listed, but only abnormal results are displayed) Labs Reviewed - No data to display  EKG   Radiology No results found.  Procedures Procedures (including critical care time)  Medications Ordered in UC Medications - No data to display  Initial Impression / Assessment and Plan / UC Course  I have reviewed the triage vital signs and the nursing notes.  Pertinent labs & imaging results that were available during my care of the patient were reviewed by me and considered in my medical decision making (see chart for details).   Patient is a nontoxic-appearing 56 year old female here for evaluation of eye discharge and cough that been going on for the past week.  She reports that she typically runs low so her temperature being 99 is a fever for her.  Her cough is intermittently productive but mostly nonproductive and that is worse at night.  She reports that the cough is interfering with her sleep.  She also describes feeling mucousy discharge from both eyes but she has no redness and she denies any itching.  Also no photophobia or vision changes.  Visual acuity shows OU 20/30, OD 20/30, and patient did not  attempt OS.  She has a lower strength contact in her left eye designed for close-up reading so she did not want to attempt the eye chart.  I suspect that the [facility] discharge is coming from patient's upper respiratory infection and that she does not have conjunctivitis based on the lack of injection erythema of the conjunctiva and also the lack of discharge present on exam.  I do suspect bronchitis as well given her rhonchi and deep, harsh cough.  I explained to the patient that most bronchitis is viral and I do not feel she needs an antibiotic at this time.  I will prescribe her Atrovent nasal spray to help with nasal congestion and postnasal drip, Tessalon Perles to help with calming down the cough reflex, and Promethazine DM cough syrup use at bedtime help with cough and congestion.  I have also prescribed albuterol inhaler with a spacer that she can use every 4-6 hours as needed for shortness of breath or wheezing.  Return precautions reviewed.  Work note provided.   Final Clinical Impressions(s) / UC Diagnoses   Final diagnoses:  Upper respiratory tract infection, unspecified type  Bronchitis     Discharge Instructions      Use the albuterol inhaler/nebulizer every 4-6 hours as needed for shortness of breath, wheezing, and cough.  Use the Atrovent nasal spray every 6 hours to help with congestion and postnasal drip. Two squirts in each nostril.  Use the Tessalon Perles every 8 hours for your cough.  Taken with a small sip of water.  They may give you some  numbness to the base of your tongue or metallic taste in her mouth, this is normal.  They are designed to calm down the cough reflex.  Use the Promethazine DM cough syrup at bedtime as will make you drowsy.  You may take 1 teaspoon (5 mL) every 6 hours.  Return for reevaluation for new or worsening symptoms.      ED Prescriptions     Medication Sig Dispense Auth. Provider   albuterol (VENTOLIN HFA) 108 (90 Base) MCG/ACT inhaler  Inhale 2 puffs into the lungs every 4 (four) hours as needed. 18 g Margarette Canada, NP   Spacer/Aero-Holding Chambers (AEROCHAMBER MV) inhaler Use as instructed 1 each Margarette Canada, NP   benzonatate (TESSALON) 100 MG capsule Take 2 capsules (200 mg total) by mouth every 8 (eight) hours. 21 capsule Margarette Canada, NP   ipratropium (ATROVENT) 0.06 % nasal spray Place 1 spray into both nostrils 3 (three) times daily. 15 mL Margarette Canada, NP   promethazine-dextromethorphan (PROMETHAZINE-DM) 6.25-15 MG/5ML syrup Take 5 mLs by mouth 4 (four) times daily as needed. 118 mL Margarette Canada, NP      PDMP not reviewed this encounter.   Margarette Canada, NP 05/28/22 [phone]

## 2022-05-28 NOTE — Discharge Instructions (Signed)
Use the albuterol inhaler/nebulizer every 4-6 hours as needed for shortness of breath, wheezing, and cough.  Use the Atrovent nasal spray every 6 hours to help with congestion and postnasal drip. Two squirts in each nostril.  Use the Tessalon Perles every 8 hours for your cough.  Taken with a small sip of water.  They may give you some numbness to the base of your tongue or metallic taste in her mouth, this is normal.  They are designed to calm down the cough reflex.  Use the Promethazine DM cough syrup at bedtime as will make you drowsy.  You may take 1 teaspoon (5 mL) every 6 hours.  Return for reevaluation for new or worsening symptoms.

## 2022-08-04 ENCOUNTER — Ambulatory Visit
Admission: EM | Admit: 2022-08-04 | Discharge: 2022-08-04 | Disposition: A | Discharge status: Home / Self Care | Payer: BC Managed Care – PPO | Attending: Nurse Practitioner | Admitting: Nurse Practitioner

## 2022-08-04 DIAGNOSIS — J029 Acute pharyngitis, unspecified: Secondary | ICD-10-CM

## 2022-08-04 DIAGNOSIS — H1031 Unspecified acute conjunctivitis, right eye: Secondary | ICD-10-CM | POA: Diagnosis present

## 2022-08-04 LAB — GROUP A STREP BY PCR: Group A Strep by PCR: NOT DETECTED

## 2022-08-04 MED ORDER — POLYMYXIN B-TRIMETHOPRIM 10000-0.1 UNIT/ML-% OP SOLN: 1.0000 [drp] | OPHTHALMIC | 0 refills | Status: AC

## 2022-08-04 NOTE — Discharge Instructions (Signed)
Take antibiotic eye drops as prescribed. Be sure not to touch your eye with the tip of the applicator  Continue to wear your eyeglasses until symptoms have resolved  Place cool, wet cloths on the eyes. You may use artificial tears used 2-6 times a day as needed  Do not wear contacts until the infection is gone.  Do not wear eye makeup until the infection is gone. Clean or replace makeup brushes  Change or wash your pillowcases every day. Do not share towels or washcloths. Wash your hands often with soap and water for at least 20 seconds and especially before touching your face or eyes.  Do not touch or rub your eyes.

## 2022-08-04 NOTE — ED Triage Notes (Signed)
Pt c/o right eye drainage x1day and nasal congestion with sore throat x2-3days  Pt states that she has bad sinuses and they feel plugged.

## 2022-08-04 NOTE — ED Provider Notes (Signed)
[facility]    CSN: [id number] Arrival date & time: 08/04/22  1846      History   Chief Complaint Chief Complaint  Patient presents with   Eye Drainage         HPI Vanessa Huffman is a 56 y.o. female.   Subjective:  Vanessa Huffman is a 56 y.o. female who presents for evaluation of redness and drainage. She has noticed the above symptoms in the right eye for the past several hours. Onset was acute. Symptoms have included discharge, erythema, itching, and tearing. Patient denies blurred vision, foreign body sensation, pain, photophobia, and visual field deficit. There is a history of contact lens use. No exposure to chemicals, allergies, contacts with similar symptoms, or trauma. She's also had a sore throat recently. No fevers.   The following portions of the patient's history were reviewed and updated as appropriate: allergies, current medications, past family history, past medical history, past social history, past surgical history, and problem list.         Past Medical History:  Diagnosis Date   Anxiety    Hypertension     There are no problems to display for this patient.   Past Surgical History:  Procedure Laterality Date   ABDOMINAL HYSTERECTOMY     APPENDECTOMY     CHOLECYSTECTOMY      OB History   No obstetric history on file.      Home Medications    Prior to Admission medications   Medication Sig Start Date End Date Taking? Authorizing Provider  amitriptyline (ELAVIL) 100 MG tablet Take 1 tablet (100 mg total) by mouth at bedtime. 09/26/21  Yes Vanna Scotland, MD  atorvastatin (LIPITOR) 40 MG tablet Take 40 mg by mouth at bedtime. 04/03/20  Yes [provider]  buPROPion (WELLBUTRIN XL) 150 MG 24 hr tablet Take 150 mg by mouth every morning. 02/15/20  Yes [provider]  escitalopram (LEXAPRO) 20 MG tablet Take 20 mg by mouth daily.   Yes [provider]  estradiol (ESTRACE) 0.1 MG/GM vaginal  cream Dispose of applicator. Apply pea size amount to area Monday, Wednesday, Friday evening. 03/13/20  Yes Vanna Scotland, MD  ipratropium (ATROVENT) 0.06 % nasal spray Place 1 spray into both nostrils 3 (three) times daily. 05/28/22  Yes Becky Augusta, NP  oxybutynin (DITROPAN XL) 15 MG 24 hr tablet TAKE 1 TABLET BY MOUTH EVERY DAY 07/09/21  Yes Vanna Scotland, MD  solifenacin (VESICARE) 5 MG tablet Take 1 tablet (5 mg total) by mouth daily. 10/10/21  Yes Vanna Scotland, MD  triamcinolone (NASACORT) 55 MCG/ACT AERO nasal inhaler 2 sprays daily. 09/04/21  Yes [provider]  trimethoprim-polymyxin b (POLYTRIM) ophthalmic solution Place 1 drop into the right eye every 4 (four) hours for 7 days. 08/04/22 08/11/22 Yes Lurline Idol, FNP  metoprolol succinate (TOPROL-XL) 25 MG 24 hr tablet Take 25 mg by mouth daily.  01/05/20  [provider]    Family History Family History  Problem Relation Age of Onset   Breast cancer Maternal Aunt        2 mat aunts    Social History Social History   Tobacco Use   Smoking status: Never   Smokeless tobacco: Never  Vaping Use   Vaping Use: Never used  Substance Use Topics   Alcohol use: No   Drug use: Never     Allergies   Acetaminophen-codeine and Codeine   Review of Systems Review of Systems  Constitutional:  Negative for fever.  HENT:  Positive for sore throat. Negative for congestion.   Eyes:  Positive for discharge and redness. Negative for photophobia, pain, itching and visual disturbance.  Respiratory:  Negative for cough.   Neurological:  Negative for headaches.     Physical Exam Triage Vital Signs ED Triage Vitals  Enc Vitals Group     BP 08/04/22 1955 136/72     Pulse Rate 08/04/22 1955 100     Resp 08/04/22 1955 12     Temp 08/04/22 1955 98.2 F (36.8 C)     Temp Source 08/04/22 1955 Oral     SpO2 08/04/22 1955 96 %     Weight 08/04/22 1954 220 lb (99.8 kg)     Height 08/04/22 1954 5\' 5"  (1.651 m)      Head Circumference --      Peak Flow --      Pain Score 08/04/22 1953 7     Pain Loc --      Pain Edu? --      Excl. in [place]? --    No data found.  Updated Vital Signs BP 136/72 (BP Location: Left Arm)   Pulse 100   Temp 98.2 F (36.8 C) (Oral)   Resp 12   Ht 5\' 5"  (1.651 m)   Wt 220 lb (99.8 kg)   SpO2 96%   BMI 36.61 kg/m   Visual Acuity Right Eye Distance:   Left Eye Distance:   Bilateral Distance:    Right Eye Near:   Left Eye Near:    Bilateral Near:     Physical Exam Constitutional:      General: She is not in acute distress.    Appearance: Normal appearance. She is not ill-appearing, toxic-appearing or diaphoretic.  HENT:     Head: Normocephalic.     Nose: Nose normal.     Mouth/Throat:     Mouth: Mucous membranes are moist.     Pharynx: No oropharyngeal exudate or posterior oropharyngeal erythema.  Eyes:     General: Lids are normal. Vision grossly intact. Gaze aligned appropriately. No visual field deficit.       Right eye: Discharge present.     Extraocular Movements: Extraocular movements intact.     Conjunctiva/sclera:     Right eye: Right conjunctiva is injected.  Cardiovascular:     Rate and Rhythm: Normal rate.  Pulmonary:     Effort: Pulmonary effort is normal.  Musculoskeletal:        General: Normal range of motion.     Cervical back: Normal range of motion and neck supple.  Lymphadenopathy:     Cervical: No cervical adenopathy.  Skin:    General: Skin is warm and dry.  Neurological:     General: No focal deficit present.     Mental Status: She is alert and oriented to person, place, and time.      UC Treatments / Results  Labs (all labs ordered are listed, but only abnormal results are displayed) Labs Reviewed  GROUP A STREP BY PCR    EKG   Radiology No results found.  Procedures Procedures (including critical care time)  Medications Ordered in UC Medications - No data to display  Initial Impression / Assessment  and Plan / UC Course  I have reviewed the triage vital signs and the nursing notes.  Pertinent labs & imaging results that were available during my care of the patient were reviewed by me and considered in my  medical decision making (see chart for details).    56 yo female presenting with acute conjunctivitis of the right eye and sore throat. Strep negative. Ophthalmic drops per orders. Warm compress to eye. Local eye care discussed. Discussed the diagnosis and proper care of conjunctivitis.  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   Today's evaluation has revealed no signs of a dangerous process. Discussed diagnosis with patient and/or guardian. Patient and/or guardian aware of their diagnosis, possible red flag symptoms to watch out for and need for close follow up. Patient and/or guardian understands verbal and written discharge instructions. Patient and/or guardian comfortable with plan and disposition.  Patient and/or guardian has a clear mental status at this time, good insight into illness (after discussion and teaching) and has clear judgment to make decisions regarding their care  Documentation was completed with the aid of voice recognition software. Transcription may contain typographical errors. Final Clinical Impressions(s) / UC Diagnoses   Final diagnoses:  Acute conjunctivitis of right eye, unspecified acute conjunctivitis type  Viral pharyngitis     Discharge Instructions      Take antibiotic eye drops as prescribed. Be sure not to touch your eye with the tip of the applicator  Continue to wear your eyeglasses until symptoms have resolved  Place cool, wet cloths on the eyes. You may use artificial tears used 2-6 times a day as needed  Do not wear contacts until the infection is gone.  Do not wear eye makeup until the infection is gone. Clean or replace makeup brushes  Change or wash your pillowcases every day. Do not share towels or washcloths. Wash your  hands often with soap and water for at least 20 seconds and especially before touching your face or eyes.  Do not touch or rub your eyes.     ED Prescriptions     Medication Sig Dispense Auth. Provider   trimethoprim-polymyxin b (POLYTRIM) ophthalmic solution Place 1 drop into the right eye every 4 (four) hours for 7 days. 10 mL Lurline Idol, FNP      PDMP not reviewed this encounter.   Lurline Idol, [place] 08/04/22 2051

## 2022-09-25 ENCOUNTER — Ambulatory Visit: Payer: BC Managed Care – PPO | Admitting: Urology

## 2022-10-02 ENCOUNTER — Ambulatory Visit: Payer: BC Managed Care – PPO | Admitting: Urology

## 2022-10-02 ENCOUNTER — Encounter: Payer: Self-pay | Admitting: Urology

## 2022-10-16 ENCOUNTER — Other Ambulatory Visit
Admission: RE | Admit: 2022-10-16 | Discharge: 2022-10-16 | Disposition: A | Discharge status: Home / Self Care | Payer: BC Managed Care – PPO | Attending: Urology | Admitting: Urology

## 2022-10-16 ENCOUNTER — Other Ambulatory Visit: Payer: Self-pay

## 2022-10-16 ENCOUNTER — Ambulatory Visit (INDEPENDENT_AMBULATORY_CARE_PROVIDER_SITE_OTHER): Payer: BC Managed Care – PPO | Admitting: Urology

## 2022-10-16 VITALS — BP 116/62 | HR 101 | Ht 65.0 in | Wt 233.5 lb

## 2022-10-16 DIAGNOSIS — N3010 Interstitial cystitis (chronic) without hematuria: Secondary | ICD-10-CM

## 2022-10-16 DIAGNOSIS — N3944 Nocturnal enuresis: Secondary | ICD-10-CM | POA: Diagnosis not present

## 2022-10-16 DIAGNOSIS — N3000 Acute cystitis without hematuria: Secondary | ICD-10-CM

## 2022-10-16 DIAGNOSIS — N390 Urinary tract infection, site not specified: Secondary | ICD-10-CM | POA: Diagnosis present

## 2022-10-16 LAB — URINALYSIS, COMPLETE (UACMP) WITH MICROSCOPIC
Bilirubin Urine: NEGATIVE
Glucose, UA: NEGATIVE mg/dL
Hgb urine dipstick: NEGATIVE
Ketones, ur: NEGATIVE mg/dL
Leukocytes,Ua: NEGATIVE
Nitrite: NEGATIVE
Protein, ur: 30 mg/dL — AB
Specific Gravity, Urine: 1.025 (ref 1.005–1.030)
pH: 7 (ref 5.0–8.0)

## 2022-10-16 LAB — BLADDER SCAN AMB NON-IMAGING: Scan Result: 0

## 2022-10-16 MED ORDER — SOLIFENACIN SUCCINATE 5 MG PO TABS: 5.0000 mg | ORAL_TABLET | Freq: Every day | ORAL | 11 refills | Status: DC

## 2022-10-16 MED ORDER — ESTRADIOL 0.1 MG/GM VA CREA: TOPICAL_CREAM | VAGINAL | 12 refills | Status: AC

## 2022-10-16 NOTE — Progress Notes (Signed)
Vanessa Huffman,acting as a scribe for Hollice Espy, MD.,have documented all relevant documentation on the behalf of Hollice Espy, MD,as directed by  Hollice Espy, MD while in the presence of Hollice Espy, MD.  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  She was last seen in 09/2021. She has been managed on estrogen cream as well as Elavil chronically for her IC. She was previously on oxybutynin but due to worsening urinary symptoms, her medication was changed to Vesicare and then for IC and amitriptyline 100 mg.   Today, she denies any urinary tract infections so far this year. She states that she has stopped using the estrogen cream. She reports a single flare of her IC. She feels that the Vesicare has been beneficial. She denies issues with constipation, dry eyes or dry mouth in comparison to other medications. She reports episodes of nocturnal enuresis once every 2 weeks which is a decrease.   She notes that her husband was recently diagnosed with stage 2 colorectal cancer and she has been under a large amount of stress.  Results for orders placed or performed in visit on 10/16/22  Bladder Scan (Post Void Residual) in office  Result Value Ref Range   Scan Result 0 ml   Results for orders placed or performed during the hospital encounter of 10/16/22  Urinalysis, Complete w Microscopic -  Result Value Ref Range   Color, Urine YELLOW YELLOW   APPearance CLEAR CLEAR   Specific Gravity, Urine 1.025 1.005 - 1.030   pH 7.0 5.0 - 8.0   Glucose, UA NEGATIVE NEGATIVE mg/dL   Hgb urine dipstick NEGATIVE NEGATIVE   Bilirubin Urine NEGATIVE NEGATIVE   Ketones, ur NEGATIVE  NEGATIVE mg/dL   Protein, ur 30 (A) NEGATIVE mg/dL   Nitrite NEGATIVE NEGATIVE   Leukocytes,Ua NEGATIVE NEGATIVE   Squamous Epithelial / HPF 11-20 0 - 5 /HPF   WBC, UA 0-5 0 - 5 WBC/hpf   RBC / HPF 0-5 0 - 5 RBC/hpf   Bacteria, UA FEW (A) NONE SEEN    PMH: Past Medical History:  Diagnosis Date   Anxiety    Hypertension     Surgical History: Past Surgical History:  Procedure Laterality Date   ABDOMINAL HYSTERECTOMY     APPENDECTOMY     CHOLECYSTECTOMY      Home Medications:  Allergies as of 10/16/2022       Reactions   Acetaminophen-codeine Other (See Comments)   Lethargy   Codeine Other (See Comments)        Medication List        Accurate as of October 16, 2022 11:24 AM. If you have any questions, ask your nurse or doctor.          STOP taking these medications    oxybutynin 15 MG 24 hr tablet Commonly known as: DITROPAN XL Stopped by: Hollice Espy, MD       TAKE these medications    amitriptyline 100 MG tablet Commonly known as: ELAVIL Take 1 tablet (100 mg total) by mouth at bedtime.   atorvastatin 40 MG tablet Commonly known as: LIPITOR Take 40 mg by mouth at bedtime.  buPROPion 150 MG 24 hr tablet Commonly known as: WELLBUTRIN XL Take 150 mg by mouth every morning.   escitalopram 20 MG tablet Commonly known as: LEXAPRO Take 20 mg by mouth daily.   estradiol 0.1 MG/GM vaginal cream Commonly known as: ESTRACE Estrogen Cream Instruction Discard applicator Apply pea sized amount to tip of finger to urethra before bed. Wash hands well after application. Use Monday, Wednesday and Friday What changed: additional instructions Changed by: Hollice Espy, MD   ipratropium 0.06 % nasal spray Commonly known as: ATROVENT Place 1 spray into both nostrils 3 (three) times daily.   solifenacin 5 MG tablet Commonly known as: VESICARE Take 1 tablet (5 mg total) by mouth daily.   triamcinolone 55 MCG/ACT Aero nasal inhaler Commonly known as:  NASACORT 2 sprays daily.        Allergies:  Allergies  Allergen Reactions   Acetaminophen-Codeine Other (See Comments)    Lethargy   Codeine Other (See Comments)    Family History: Family History  Problem Relation Age of Onset   Breast cancer Maternal Aunt        2 mat aunts    Social History:  reports that she has never smoked. She has never used smokeless tobacco. She reports that she does not drink alcohol and does not use drugs.   Physical Exam: BP 116/62   Pulse (!) 101   Ht '5\' 5"'$  (1.651 m)   Wt 233 lb 8 oz (105.9 kg)   BMI 38.86 kg/m   Constitutional:  Alert and oriented, No acute distress. HEENT: [place] AT, moist mucus membranes.  Trachea midline, no masses. Neurologic: Grossly intact, no focal deficits, moving all 4 extremities. Psychiatric: Normal mood and affect.  Assessment & Plan:    1. Interstitial cystitis - Continue amitriptyline 100 mg. A refill was ordered.  2. Recurrent UTIs - Has not had a UTI so far this year. - Continue topical estradiol. A refill was ordered.   3. Nocturnal enuresis - We discussed adding a medication or Botox and agreed to address this in the future as she is going through so much at this time with her husband. - Continue Vesicare 5 mg daily.  Return in about 1 year (around 10/16/2023) for 44yrfollow up UA & PVR .   BPiedra Aguza[address] [place] [phone]

## 2023-03-09 ENCOUNTER — Other Ambulatory Visit: Payer: Self-pay | Admitting: Physician Assistant

## 2023-03-09 DIAGNOSIS — S0990XA Unspecified injury of head, initial encounter: Secondary | ICD-10-CM

## 2023-03-09 DIAGNOSIS — R296 Repeated falls: Secondary | ICD-10-CM

## 2023-03-15 ENCOUNTER — Ambulatory Visit: Payer: BC Managed Care – PPO

## 2023-04-23 IMAGING — RF DG ESOPHAGUS
9 of 12 series · 14 of 24 positions shown · non-contrast
Comparison: None.

CLINICAL DATA: Dysphagia for 1 year

EXAM:
ESOPHOGRAM / BARIUM SWALLOW / BARIUM TABLET STUDY
TECHNIQUE: Combined double contrast and single contrast examination performed
using effervescent crystals, thick barium liquid, and thin barium
liquid. The patient was observed with fluoroscopy swallowing a 13 mm
barium sulphate tablet.
FLUOROSCOPY TIME:  Fluoroscopy Time:  2 minute 54 seconds
Radiation Exposure Index (if provided by the fluoroscopic device):
18.8 mGy
Number of Acquired Spot Images: 0

[Series 1: cp_standard · 0.25mm/px · 2 of 41 frames shown (1 of 9)]
[frame 7/41]
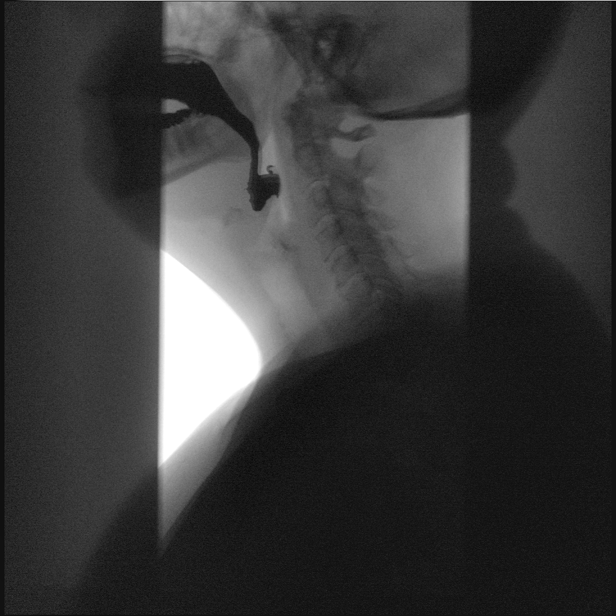
[frame 21/41]
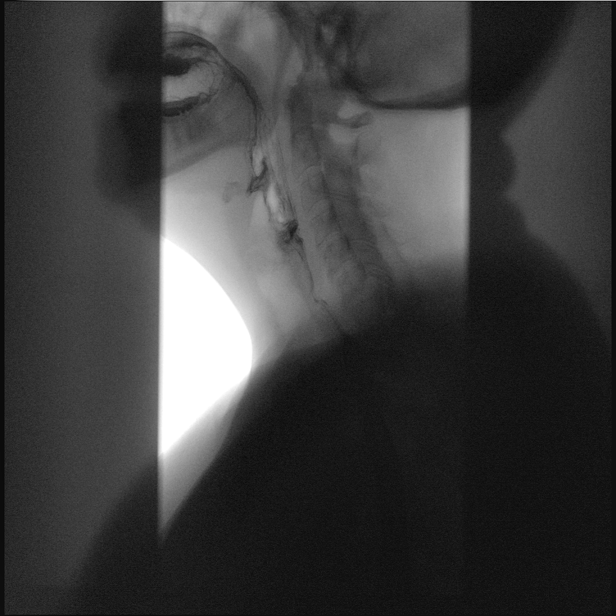

[Series 2: cp_standard · 0.25mm/px · 2 of 54 frames shown (2 of 9)]
[frame 26/54]
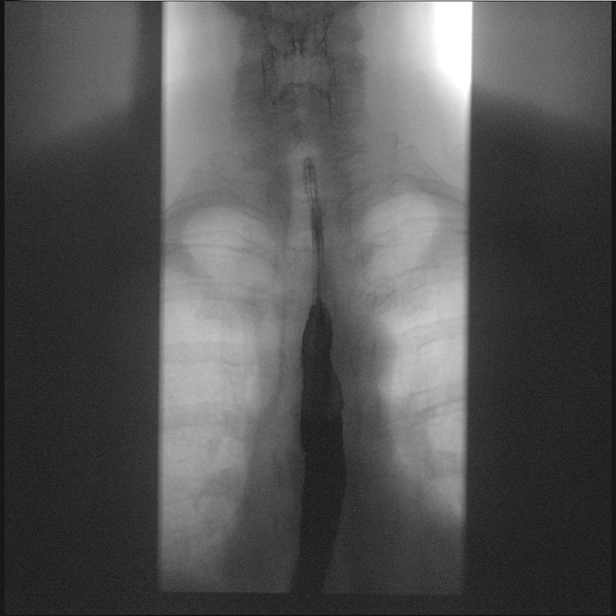
[frame 46/54]
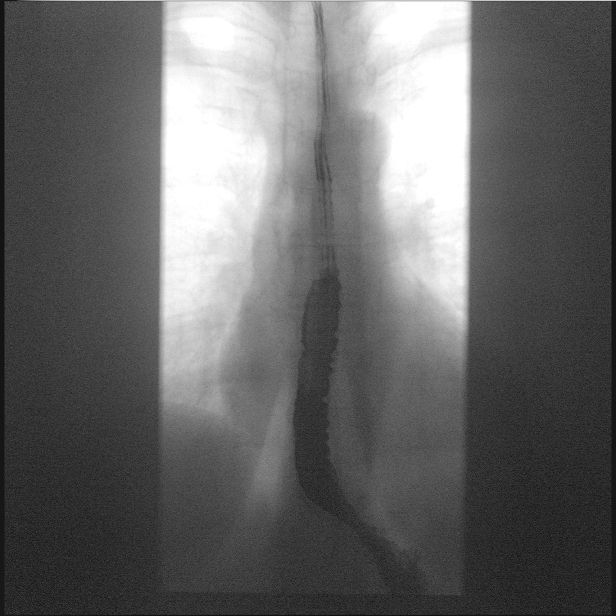

[Series 3: cp_standard · 0.25mm/px · 2 of 70 frames shown (3 of 9)]
[frame 11/70]
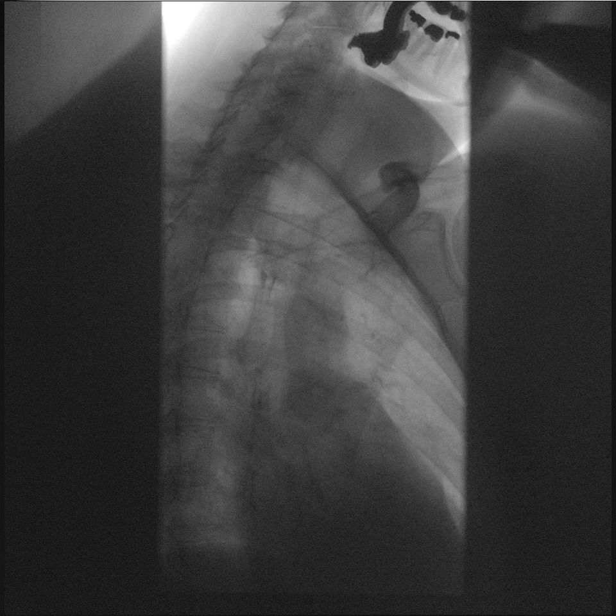
[frame 60/70]
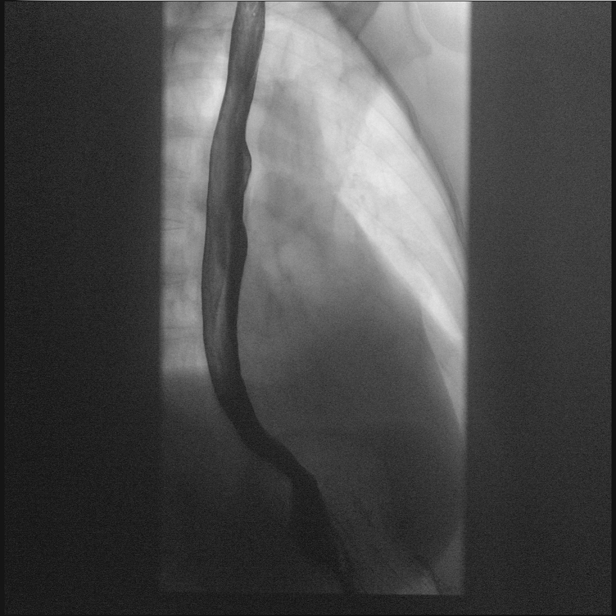

[Series 5: cp_standard · 0.25mm/px · 1 of 1 slices shown (4 of 9)]
[im 1/1]
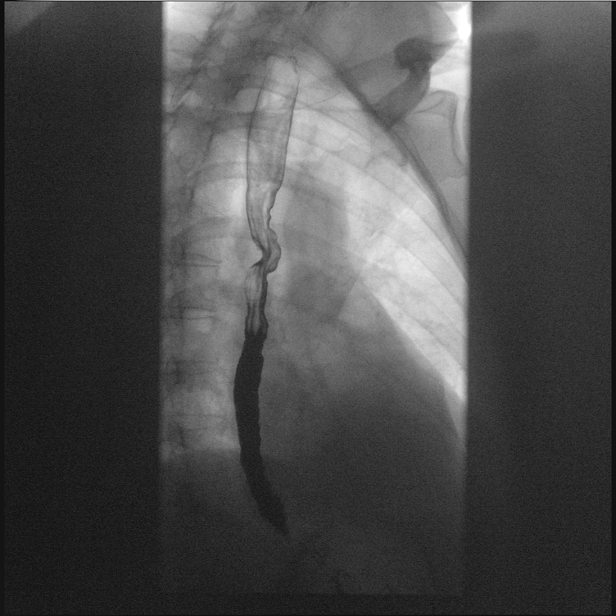

[Series 6: cp_standard · 0.25mm/px · 1 of 1 slices shown (5 of 9)]
[im 1/1]
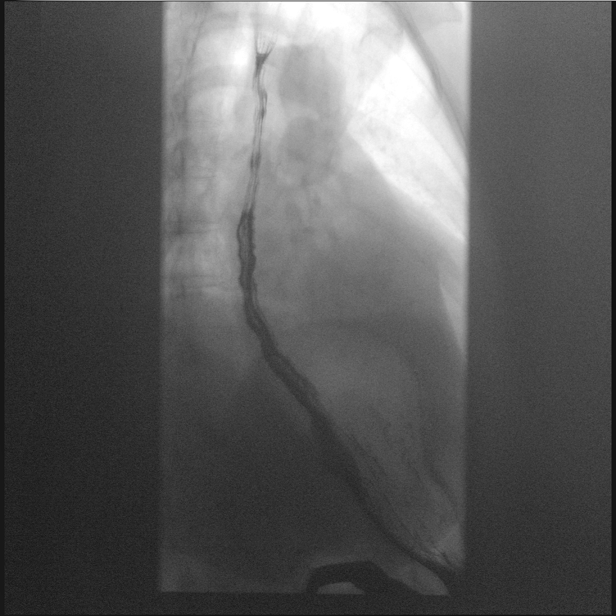

[Series 7: cp_standard · 0.28mm/px · 1 of 77 frames shown (6 of 9)]
[frame 39/77]
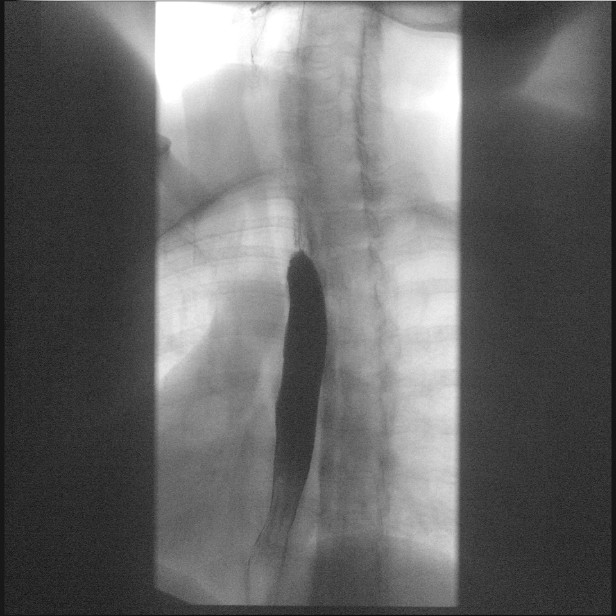

[Series 8: cp_standard · 0.29mm/px · 3 of 102 frames shown (7 of 9)]
[frame 16/102]
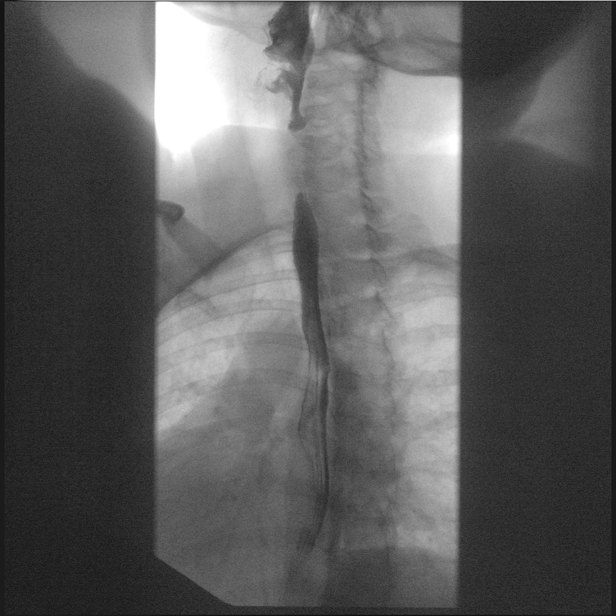
[frame 87/102]
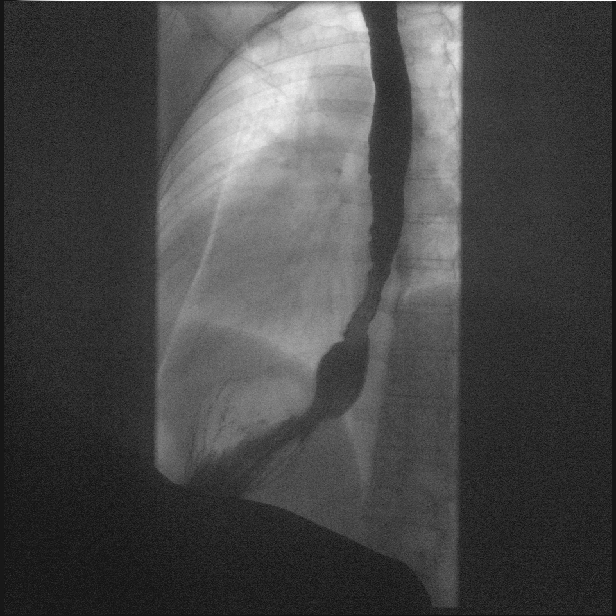
[frame 101/102]
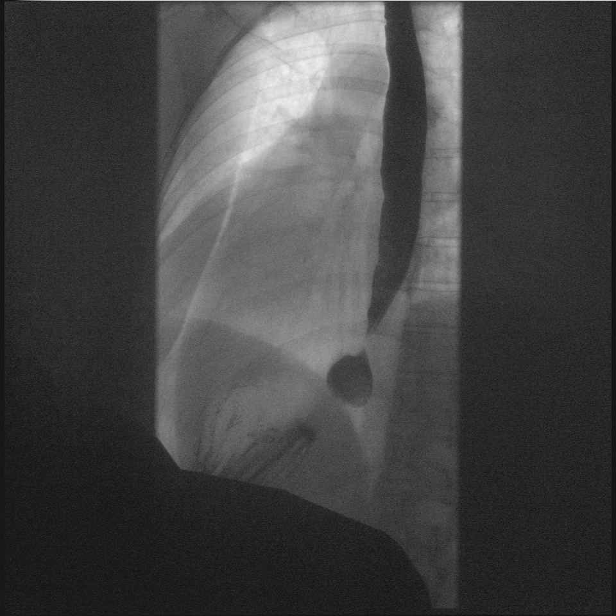

[Series 11: cp_standard · 0.20mm/px · 1 of 1 slices shown (8 of 9)]
[im 1/1]
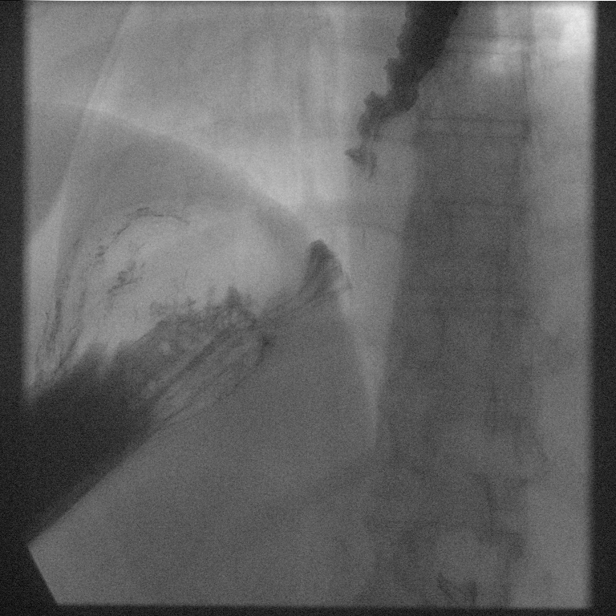

[Series 13: cp_standard · 0.27mm/px · 1 of 1 slices shown (9 of 9)]
[im 1/1]
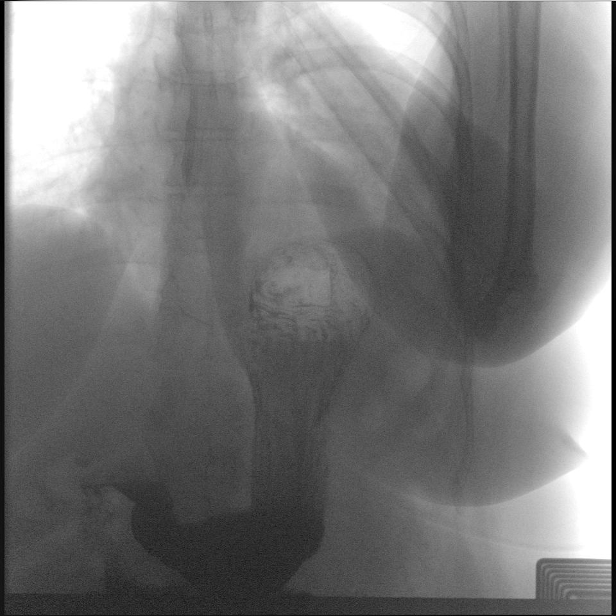

[14 of 24 positions shown; findings below may reference images not displayed]

FINDINGS: Normal pharyngeal anatomy and motility. Contrast flowed freely
through the esophagus without evidence of a stricture or mass.
Normal esophageal mucosa without evidence of irregularity or
ulceration. Severe tertiary contractions of the esophagus as can be
seen with spasm. No evidence of reflux. No definite hiatal hernia
was demonstrated.

At the end of the examination a 13 mm barium tablet was administered
which transited through the esophagus and esophagogastric junction
without delay.
IMPRESSION: Severe tertiary contractions of the esophagus as can be seen with
spasm.

No esophageal stricture.

## 2023-10-15 ENCOUNTER — Other Ambulatory Visit
Admission: RE | Admit: 2023-10-15 | Discharge: 2023-10-15 | Disposition: A | Discharge status: Home / Self Care | Payer: BC Managed Care – PPO | Attending: Urology | Admitting: Urology

## 2023-10-15 ENCOUNTER — Other Ambulatory Visit: Payer: Self-pay

## 2023-10-15 ENCOUNTER — Encounter: Payer: Self-pay | Admitting: Urology

## 2023-10-15 ENCOUNTER — Ambulatory Visit: Payer: BC Managed Care – PPO | Admitting: Urology

## 2023-10-15 VITALS — BP 124/75 | HR 104 | Ht 65.0 in | Wt 250.0 lb

## 2023-10-15 DIAGNOSIS — Z87440 Personal history of urinary (tract) infections: Secondary | ICD-10-CM

## 2023-10-15 DIAGNOSIS — N390 Urinary tract infection, site not specified: Secondary | ICD-10-CM | POA: Diagnosis present

## 2023-10-15 DIAGNOSIS — N3010 Interstitial cystitis (chronic) without hematuria: Secondary | ICD-10-CM | POA: Diagnosis not present

## 2023-10-15 DIAGNOSIS — N3281 Overactive bladder: Secondary | ICD-10-CM | POA: Diagnosis not present

## 2023-10-15 LAB — URINALYSIS, COMPLETE (UACMP) WITH MICROSCOPIC
Bilirubin Urine: NEGATIVE
Glucose, UA: NEGATIVE mg/dL
Ketones, ur: NEGATIVE mg/dL
Nitrite: NEGATIVE
Protein, ur: NEGATIVE mg/dL
Specific Gravity, Urine: 1.01 (ref 1.005–1.030)
pH: 6.5 (ref 5.0–8.0)

## 2023-10-15 LAB — BLADDER SCAN AMB NON-IMAGING

## 2023-10-15 NOTE — Progress Notes (Signed)
 [name],acting as a scribe for Vanessa Scotland, MD.,have documented all relevant documentation on the behalf of Vanessa Scotland, MD,as directed by  Vanessa Scotland, MD while in the presence of Vanessa Scotland, MD.  10/15/23 10:12 AM   Vanessa Huffman 05-27-1966 [id number]  Referring provider: [facility] [address] [place],  [place] [address]  Chief Complaint  Patient presents with   Recurrent UTI   IC    HPI: 58 y/o female referred for annual follow-up of recurrent UTIs, interstitial cystitis, and OAB.   She has a personal history of these conditions and is currently managed on Vesicare, which is effectively controlling her urinary symptoms. She also takes 100 mg of Amitriptyline for her IC, which is managed by her primary care provider and has been effective. She was last seen a year ago.   She has not experienced any UTIs in the past year. A urinalysis is pending, and no new imaging has been conducted. She is not currently using estrogen cream, but it is recommended if UTIs recur. The Vesicare is working well, and She has not had any IC flares this year.   Results for orders placed or performed during the hospital encounter of 10/15/23  Urinalysis, Complete w Microscopic -  Result Value Ref Range   Color, Urine STRAW (A) YELLOW   APPearance HAZY (A) CLEAR   Specific Gravity, Urine 1.010 1.005 - 1.030   pH 6.5 5.0 - 8.0   Glucose, UA NEGATIVE NEGATIVE mg/dL   Hgb urine dipstick TRACE (A) NEGATIVE   Bilirubin Urine NEGATIVE NEGATIVE   Ketones, ur NEGATIVE NEGATIVE mg/dL   Protein, ur NEGATIVE NEGATIVE mg/dL   Nitrite NEGATIVE NEGATIVE   Leukocytes,Ua TRACE (A) NEGATIVE   Squamous Epithelial / HPF 6-10 0 - 5 /HPF   WBC, UA 6-10 0 - 5 WBC/hpf   RBC / HPF 0-5 0 - 5 RBC/hpf   Bacteria, UA FEW (A) NONE SEEN  Results for orders placed or performed in visit on 10/15/23  Bladder Scan (Post Void Residual) in office  Result Value Ref Range   Scan Result        PMH: Past Medical History:  Diagnosis Date   Anxiety    Hypertension    IC (interstitial cystitis)    Recurrent UTI     Surgical History: Past Surgical History:  Procedure Laterality Date   ABDOMINAL HYSTERECTOMY     APPENDECTOMY     CHOLECYSTECTOMY      Home Medications:  Allergies as of 10/15/2023       Reactions   Acetaminophen-codeine Other (See Comments)   Lethargy   Codeine Other (See Comments)   Semaglutide Other (See Comments), Nausea Only, Nausea And Vomiting        Medication List        Accurate as of October 15, 2023 10:12 AM. If you have any questions, ask your nurse or doctor.          albuterol (2.5 MG/3ML) 0.083% nebulizer solution Commonly known as: PROVENTIL Inhale into the lungs. Take 3 mLs (2.5 mg total) by nebulization every 6 (six) hours as needed for Wheezing   albuterol 108 (90 Base) MCG/ACT inhaler Commonly known as: VENTOLIN HFA Inhale 2 puffs into the lungs every 6 (six) hours as needed.   amitriptyline 50 MG tablet Commonly known as: ELAVIL Take 50 mg by mouth at bedtime. What changed: Another medication with the same name was removed. Continue taking this medication, and  follow the directions you see here. Changed by: Vanessa Huffman   atorvastatin 40 MG tablet Commonly known as: LIPITOR Take 40 mg by mouth at bedtime.   buPROPion 150 MG 24 hr tablet Commonly known as: WELLBUTRIN XL Take 150 mg by mouth every morning.   escitalopram 20 MG tablet Commonly known as: LEXAPRO Take 20 mg by mouth daily.   estradiol 0.1 MG/GM vaginal cream Commonly known as: ESTRACE Estrogen Cream Instruction Discard applicator Apply pea sized amount to tip of finger to urethra before bed. Wash hands well after application. Use Monday, Wednesday and Friday   ipratropium 0.06 % nasal spray Commonly known as: ATROVENT Place 1 spray into both nostrils 3 (three) times daily.   metFORMIN 500 MG 24 hr tablet Commonly known as:  GLUCOPHAGE-XR Take 500 mg by mouth daily before supper.   solifenacin 5 MG tablet Commonly known as: VESICARE Take 1 tablet (5 mg total) by mouth daily.   triamcinolone 55 MCG/ACT Aero nasal inhaler Commonly known as: NASACORT 2 sprays daily.        Allergies:  Allergies  Allergen Reactions   Acetaminophen-Codeine Other (See Comments)    Lethargy   Codeine Other (See Comments)   Semaglutide Other (See Comments), Nausea Only and Nausea And Vomiting    Family History: Family History  Problem Relation Age of Onset   Breast cancer Maternal Aunt        2 mat aunts    Social History:  reports that she has never smoked. She has never used smokeless tobacco. She reports that she does not drink alcohol and does not use drugs.   Physical Exam: BP 124/75 (BP Location: Left Arm, Patient Position: Sitting, Cuff Size: Large)   Pulse (!) 104   Ht 5\' 5"  (1.651 m)   Wt 250 lb (113.4 kg)   SpO2 93%   BMI 41.60 kg/m   Constitutional:  Alert and oriented, No acute distress. HEENT: [place] AT, moist mucus membranes.  Trachea midline, no masses. Neurologic: Grossly intact, no focal deficits, moving all 4 extremities. Psychiatric: Normal mood and affect.  Results for orders placed or performed in visit on 10/15/23  Bladder Scan (Post Void Residual) in office   Collection Time: 10/15/23  9:14 AM  Result Value Ref Range   Scan Result      Assessment & Plan:    1. Recurrent UTIs - No UTIs reported in the past year.  - Continue monitoring. If UTIs recur, consider reinitiating estrogen cream as a preventive measure.  2. Interstitial Cystitis - Symptoms well-controlled with Amitriptyline 100 mg.  - No flares reported this year.  - Continue current management.  3. Overactive Bladder - Symptoms well-controlled with Vesicare.  - Continue current management.  - Advised to ensure complete bladder emptying to reduce infection risk.  Return in about 1 year (around 10/14/2024).  I  have reviewed the above documentation for accuracy and completeness, and I agree with the above.   Vanessa Scotland, MD   [facility] [address] [place], [place] [address] [phone]

## 2023-10-17 ENCOUNTER — Other Ambulatory Visit: Payer: Self-pay | Admitting: Urology

## 2023-10-17 DIAGNOSIS — N3944 Nocturnal enuresis: Secondary | ICD-10-CM

## 2024-08-22 ENCOUNTER — Encounter: Payer: Self-pay | Admitting: Urology

## 2024-10-16 ENCOUNTER — Ambulatory Visit: Payer: BC Managed Care – PPO | Admitting: Urology
